# Patient Record
Sex: Female | Born: 1935 | Race: White | Hispanic: No | Marital: Single | Smoking: Never smoker
Health system: Southern US, Community
[De-identification: ages and names within clinical notes are randomized; demographics above are authoritative.]

## PROBLEM LIST (undated history)

## (undated) DIAGNOSIS — F0390 Unspecified dementia without behavioral disturbance: Secondary | ICD-10-CM

## (undated) DIAGNOSIS — I10 Essential (primary) hypertension: Secondary | ICD-10-CM

## (undated) HISTORY — PX: EYE SURGERY: SHX253

---

## 2009-02-22 ENCOUNTER — Ambulatory Visit: Payer: Self-pay | Admitting: Vascular Surgery

## 2009-02-22 ENCOUNTER — Emergency Department (HOSPITAL_COMMUNITY): Admission: EM | Admit: 2009-02-22 | Discharge: 2009-02-22 | Payer: Self-pay | Admitting: Emergency Medicine

## 2009-02-22 ENCOUNTER — Encounter (INDEPENDENT_AMBULATORY_CARE_PROVIDER_SITE_OTHER): Payer: Self-pay | Admitting: Emergency Medicine

## 2010-12-04 LAB — DIFFERENTIAL
Basophils Absolute: 0 10*3/uL (ref 0.0–0.1)
Eosinophils Absolute: 0.1 10*3/uL (ref 0.0–0.7)
Eosinophils Relative: 1 % (ref 0–5)
Monocytes Absolute: 0.7 10*3/uL (ref 0.1–1.0)

## 2010-12-04 LAB — BASIC METABOLIC PANEL
BUN: 13 mg/dL (ref 6–23)
CO2: 26 mEq/L (ref 19–32)
Chloride: 105 mEq/L (ref 96–112)
Glucose, Bld: 121 mg/dL — ABNORMAL HIGH (ref 70–99)
Potassium: 3.6 mEq/L (ref 3.5–5.1)
Sodium: 140 mEq/L (ref 135–145)

## 2010-12-04 LAB — CBC
HCT: 39.9 % (ref 36.0–46.0)
Hemoglobin: 13.7 g/dL (ref 12.0–15.0)
MCV: 94.3 fL (ref 78.0–100.0)
Platelets: 192 10*3/uL (ref 150–400)
RDW: 13 % (ref 11.5–15.5)

## 2010-12-04 LAB — PROTIME-INR: Prothrombin Time: 15.3 seconds — ABNORMAL HIGH (ref 11.6–15.2)

## 2011-11-08 DIAGNOSIS — D235 Other benign neoplasm of skin of trunk: Secondary | ICD-10-CM | POA: Diagnosis not present

## 2011-11-08 DIAGNOSIS — L570 Actinic keratosis: Secondary | ICD-10-CM | POA: Diagnosis not present

## 2011-11-08 DIAGNOSIS — Z85828 Personal history of other malignant neoplasm of skin: Secondary | ICD-10-CM | POA: Diagnosis not present

## 2011-12-19 DIAGNOSIS — H43399 Other vitreous opacities, unspecified eye: Secondary | ICD-10-CM | POA: Diagnosis not present

## 2011-12-19 DIAGNOSIS — H4010X0 Unspecified open-angle glaucoma, stage unspecified: Secondary | ICD-10-CM | POA: Diagnosis not present

## 2011-12-19 DIAGNOSIS — H04129 Dry eye syndrome of unspecified lacrimal gland: Secondary | ICD-10-CM | POA: Diagnosis not present

## 2012-01-16 DIAGNOSIS — J3489 Other specified disorders of nose and nasal sinuses: Secondary | ICD-10-CM | POA: Diagnosis not present

## 2012-01-16 DIAGNOSIS — I10 Essential (primary) hypertension: Secondary | ICD-10-CM | POA: Diagnosis not present

## 2012-01-22 DIAGNOSIS — I10 Essential (primary) hypertension: Secondary | ICD-10-CM | POA: Diagnosis not present

## 2012-01-22 DIAGNOSIS — M279 Disease of jaws, unspecified: Secondary | ICD-10-CM | POA: Diagnosis not present

## 2012-02-15 DIAGNOSIS — R0982 Postnasal drip: Secondary | ICD-10-CM | POA: Diagnosis not present

## 2012-02-15 DIAGNOSIS — M810 Age-related osteoporosis without current pathological fracture: Secondary | ICD-10-CM | POA: Diagnosis not present

## 2012-02-15 DIAGNOSIS — M279 Disease of jaws, unspecified: Secondary | ICD-10-CM | POA: Diagnosis not present

## 2012-02-15 DIAGNOSIS — I10 Essential (primary) hypertension: Secondary | ICD-10-CM | POA: Diagnosis not present

## 2012-02-26 DIAGNOSIS — R609 Edema, unspecified: Secondary | ICD-10-CM | POA: Diagnosis not present

## 2012-02-26 DIAGNOSIS — F329 Major depressive disorder, single episode, unspecified: Secondary | ICD-10-CM | POA: Diagnosis not present

## 2012-02-26 DIAGNOSIS — M1990 Unspecified osteoarthritis, unspecified site: Secondary | ICD-10-CM | POA: Diagnosis not present

## 2012-02-26 DIAGNOSIS — I10 Essential (primary) hypertension: Secondary | ICD-10-CM | POA: Diagnosis not present

## 2012-03-03 DIAGNOSIS — I10 Essential (primary) hypertension: Secondary | ICD-10-CM | POA: Diagnosis not present

## 2012-03-03 DIAGNOSIS — R350 Frequency of micturition: Secondary | ICD-10-CM | POA: Diagnosis not present

## 2012-03-03 DIAGNOSIS — R42 Dizziness and giddiness: Secondary | ICD-10-CM | POA: Diagnosis not present

## 2012-03-03 DIAGNOSIS — R110 Nausea: Secondary | ICD-10-CM | POA: Diagnosis not present

## 2012-03-03 DIAGNOSIS — R82998 Other abnormal findings in urine: Secondary | ICD-10-CM | POA: Diagnosis not present

## 2012-03-13 ENCOUNTER — Emergency Department (HOSPITAL_COMMUNITY)
Admission: EM | Admit: 2012-03-13 | Discharge: 2012-03-13 | Disposition: A | Discharge status: Home / Self Care | Payer: Medicare Other | Attending: Emergency Medicine | Admitting: Emergency Medicine

## 2012-03-13 DIAGNOSIS — R9431 Abnormal electrocardiogram [ECG] [EKG]: Secondary | ICD-10-CM | POA: Diagnosis not present

## 2012-03-13 DIAGNOSIS — R55 Syncope and collapse: Secondary | ICD-10-CM | POA: Diagnosis not present

## 2012-03-13 DIAGNOSIS — R61 Generalized hyperhidrosis: Secondary | ICD-10-CM | POA: Diagnosis not present

## 2012-03-13 DIAGNOSIS — L0291 Cutaneous abscess, unspecified: Secondary | ICD-10-CM | POA: Diagnosis not present

## 2012-03-13 DIAGNOSIS — R404 Transient alteration of awareness: Secondary | ICD-10-CM | POA: Diagnosis not present

## 2012-03-13 DIAGNOSIS — N289 Disorder of kidney and ureter, unspecified: Secondary | ICD-10-CM | POA: Insufficient documentation

## 2012-03-13 LAB — POCT I-STAT, CHEM 8
BUN: 21 mg/dL (ref 6–23)
Creatinine, Ser: 1.5 mg/dL — ABNORMAL HIGH (ref 0.50–1.10)
Glucose, Bld: 141 mg/dL — ABNORMAL HIGH (ref 70–99)
Hemoglobin: 12.9 g/dL (ref 12.0–15.0)
Potassium: 3.3 mEq/L — ABNORMAL LOW (ref 3.5–5.1)
Sodium: 137 mEq/L (ref 135–145)
TCO2: 31 mmol/L (ref 0–100)

## 2012-03-13 LAB — POCT I-STAT TROPONIN I

## 2012-03-13 NOTE — ED Notes (Signed)
Pt eating at this time denies complaints

## 2012-03-13 NOTE — ED Provider Notes (Signed)
History     CSN: [id number]  Arrival date & time 03/13/12  1709   First MD Initiated Contact with Patient 03/13/12 1807      Chief Complaint  Patient presents with  . Loss of Consciousness     HPI Pt had syncopal episode at doctors office, was having boil removed, staff reported 2 syncopal episodes per staff. EMS reports pt pale and altered when they sat her up. Orthostatic on EMS arrival 70/40. Pt reports hx of the same. CBG 107. BP meds changed last month. 22g(R)hand. 120cc of NS given en route. Pt reports she is starting to feeling better  No past medical history on file.  No past surgical history on file.  No family history on file.  History  Substance Use Topics  . Smoking status: Not on file  . Smokeless tobacco: Not on file  . Alcohol Use: Not on file    OB History    No data available      Review of Systems  All other systems reviewed and are negative.    Allergies  Codeine  Home Medications   Current Outpatient Rx  Name Route Sig Dispense Refill  . CYCLOSPORINE 0.05 % OP EMUL Both Eyes Place 1 drop into both eyes 2 (two) times daily.    Marland Kitchen LOSARTAN POTASSIUM 50 MG PO TABS Oral Take 50 mg by mouth every morning.    Marland Kitchen MIRTAZAPINE 30 MG PO TABS Oral Take 30 mg by mouth at bedtime.    . ADULT MULTIVITAMIN W/MINERALS CH Oral Take 1 tablet by mouth daily.      BP 134/65  Pulse 88  Temp 98.3 F (36.8 C) (Oral)  Resp 18  SpO2 100%  Physical Exam  Nursing note and vitals reviewed. Constitutional: She is oriented to person, place, and time. She appears well-developed and well-nourished. No distress.  HENT:  Head: Normocephalic and atraumatic.  Eyes: Pupils are equal, round, and reactive to light.  Neck: Normal range of motion.  Cardiovascular: Normal rate and intact distal pulses.   No murmur heard.      SINUS RHYTHM ~ normal P axis, V-rate =76 NONSPECIFIC INTRAVENTRICULAR CONDUCTION DELAY ~ QRSd >119mS,  not LBBB/RBBB  Interpretation Abnormal  ECG  Pulmonary/Chest: No respiratory distress.  Abdominal: Normal appearance. She exhibits no distension.  Musculoskeletal: Normal range of motion.  Neurological: She is alert and oriented to person, place, and time. No cranial nerve deficit.  Skin: Skin is warm and dry. No rash noted.  Psychiatric: She has a normal mood and affect. Her behavior is normal.    ED Course  Procedures (including critical care time)  Labs Reviewed  POCT I-STAT, CHEM 8 - Abnormal; Notable for the following:    Potassium 3.3 (*)     Chloride 93 (*)     Creatinine, Ser 1.50 (*)     Glucose, Bld 141 (*)     All other components within normal limits  POCT I-STAT TROPONIN I   No results found.   1. Vasovagal syncope   2. Renal insufficiency       MDM         Nelia Shi, MD 03/13/12 2055

## 2012-03-13 NOTE — ED Notes (Signed)
Pt had syncopal episode at doctors office, was having boil removed, staff reported 2 syncopal episodes per staff. EMS reports pt pale and altered when they sat her up. Orthostatic on EMS arrival 70/40. Pt reports hx of the same. CBG 107. BP meds changed last month. 22g(R)hand. 120cc of NS given en route. Pt reports she is starting to feeling better.

## 2012-03-17 DIAGNOSIS — R110 Nausea: Secondary | ICD-10-CM | POA: Diagnosis not present

## 2012-03-17 DIAGNOSIS — R82998 Other abnormal findings in urine: Secondary | ICD-10-CM | POA: Diagnosis not present

## 2012-03-17 DIAGNOSIS — N189 Chronic kidney disease, unspecified: Secondary | ICD-10-CM | POA: Diagnosis not present

## 2012-03-17 DIAGNOSIS — I10 Essential (primary) hypertension: Secondary | ICD-10-CM | POA: Diagnosis not present

## 2012-04-23 DIAGNOSIS — M1990 Unspecified osteoarthritis, unspecified site: Secondary | ICD-10-CM | POA: Diagnosis not present

## 2012-04-23 DIAGNOSIS — F329 Major depressive disorder, single episode, unspecified: Secondary | ICD-10-CM | POA: Diagnosis not present

## 2012-04-23 DIAGNOSIS — N189 Chronic kidney disease, unspecified: Secondary | ICD-10-CM | POA: Diagnosis not present

## 2012-04-23 DIAGNOSIS — I10 Essential (primary) hypertension: Secondary | ICD-10-CM | POA: Diagnosis not present

## 2012-05-07 DIAGNOSIS — Z1231 Encounter for screening mammogram for malignant neoplasm of breast: Secondary | ICD-10-CM | POA: Diagnosis not present

## 2012-05-08 DIAGNOSIS — Z85828 Personal history of other malignant neoplasm of skin: Secondary | ICD-10-CM | POA: Diagnosis not present

## 2012-05-08 DIAGNOSIS — L905 Scar conditions and fibrosis of skin: Secondary | ICD-10-CM | POA: Diagnosis not present

## 2012-05-16 DIAGNOSIS — H26499 Other secondary cataract, unspecified eye: Secondary | ICD-10-CM | POA: Diagnosis not present

## 2012-05-16 DIAGNOSIS — H4010X0 Unspecified open-angle glaucoma, stage unspecified: Secondary | ICD-10-CM | POA: Diagnosis not present

## 2012-05-20 DIAGNOSIS — Z23 Encounter for immunization: Secondary | ICD-10-CM | POA: Diagnosis not present

## 2012-05-28 DIAGNOSIS — I10 Essential (primary) hypertension: Secondary | ICD-10-CM | POA: Diagnosis not present

## 2012-05-28 DIAGNOSIS — R110 Nausea: Secondary | ICD-10-CM | POA: Diagnosis not present

## 2012-05-28 DIAGNOSIS — R197 Diarrhea, unspecified: Secondary | ICD-10-CM | POA: Diagnosis not present

## 2012-06-06 DIAGNOSIS — R110 Nausea: Secondary | ICD-10-CM | POA: Diagnosis not present

## 2012-06-06 DIAGNOSIS — R42 Dizziness and giddiness: Secondary | ICD-10-CM | POA: Diagnosis not present

## 2012-06-13 ENCOUNTER — Inpatient Hospital Stay (HOSPITAL_COMMUNITY)
Admission: EM | Admit: 2012-06-13 | Discharge: 2012-06-17 | DRG: 641 | Disposition: A | Discharge status: Skilled Nursing Facility | Payer: Medicare Other | Attending: Internal Medicine | Admitting: Internal Medicine

## 2012-06-13 DIAGNOSIS — F05 Delirium due to known physiological condition: Secondary | ICD-10-CM | POA: Diagnosis present

## 2012-06-13 DIAGNOSIS — R918 Other nonspecific abnormal finding of lung field: Secondary | ICD-10-CM | POA: Diagnosis not present

## 2012-06-13 DIAGNOSIS — G4700 Insomnia, unspecified: Secondary | ICD-10-CM | POA: Diagnosis present

## 2012-06-13 DIAGNOSIS — E669 Obesity, unspecified: Secondary | ICD-10-CM

## 2012-06-13 DIAGNOSIS — E871 Hypo-osmolality and hyponatremia: Secondary | ICD-10-CM | POA: Diagnosis not present

## 2012-06-13 DIAGNOSIS — R0789 Other chest pain: Secondary | ICD-10-CM | POA: Diagnosis not present

## 2012-06-13 DIAGNOSIS — R531 Weakness: Secondary | ICD-10-CM | POA: Diagnosis present

## 2012-06-13 DIAGNOSIS — R079 Chest pain, unspecified: Secondary | ICD-10-CM

## 2012-06-13 DIAGNOSIS — R5381 Other malaise: Secondary | ICD-10-CM | POA: Diagnosis not present

## 2012-06-13 DIAGNOSIS — R110 Nausea: Secondary | ICD-10-CM | POA: Diagnosis present

## 2012-06-13 DIAGNOSIS — R627 Adult failure to thrive: Secondary | ICD-10-CM | POA: Diagnosis present

## 2012-06-13 DIAGNOSIS — I10 Essential (primary) hypertension: Secondary | ICD-10-CM | POA: Diagnosis present

## 2012-06-13 DIAGNOSIS — E876 Hypokalemia: Secondary | ICD-10-CM | POA: Diagnosis not present

## 2012-06-13 DIAGNOSIS — Z79899 Other long term (current) drug therapy: Secondary | ICD-10-CM

## 2012-06-13 DIAGNOSIS — R410 Disorientation, unspecified: Secondary | ICD-10-CM

## 2012-06-13 DIAGNOSIS — M810 Age-related osteoporosis without current pathological fracture: Secondary | ICD-10-CM | POA: Diagnosis present

## 2012-06-13 DIAGNOSIS — R0602 Shortness of breath: Secondary | ICD-10-CM | POA: Diagnosis not present

## 2012-06-13 DIAGNOSIS — N189 Chronic kidney disease, unspecified: Secondary | ICD-10-CM | POA: Diagnosis not present

## 2012-06-13 DIAGNOSIS — R82998 Other abnormal findings in urine: Secondary | ICD-10-CM | POA: Diagnosis not present

## 2012-06-13 HISTORY — DX: Essential (primary) hypertension: I10

## 2012-06-14 ENCOUNTER — Inpatient Hospital Stay (HOSPITAL_COMMUNITY): Payer: Medicare Other

## 2012-06-14 ENCOUNTER — Emergency Department (HOSPITAL_COMMUNITY): Payer: Medicare Other

## 2012-06-14 ENCOUNTER — Encounter (HOSPITAL_COMMUNITY): Payer: Self-pay | Admitting: Emergency Medicine

## 2012-06-14 DIAGNOSIS — Z5189 Encounter for other specified aftercare: Secondary | ICD-10-CM | POA: Diagnosis not present

## 2012-06-14 DIAGNOSIS — R110 Nausea: Secondary | ICD-10-CM | POA: Diagnosis present

## 2012-06-14 DIAGNOSIS — R531 Weakness: Secondary | ICD-10-CM | POA: Diagnosis present

## 2012-06-14 DIAGNOSIS — F3289 Other specified depressive episodes: Secondary | ICD-10-CM | POA: Diagnosis not present

## 2012-06-14 DIAGNOSIS — I10 Essential (primary) hypertension: Secondary | ICD-10-CM | POA: Diagnosis not present

## 2012-06-14 DIAGNOSIS — F05 Delirium due to known physiological condition: Secondary | ICD-10-CM | POA: Diagnosis not present

## 2012-06-14 DIAGNOSIS — G4700 Insomnia, unspecified: Secondary | ICD-10-CM | POA: Diagnosis present

## 2012-06-14 DIAGNOSIS — E876 Hypokalemia: Secondary | ICD-10-CM | POA: Diagnosis not present

## 2012-06-14 DIAGNOSIS — R0602 Shortness of breath: Secondary | ICD-10-CM | POA: Diagnosis not present

## 2012-06-14 DIAGNOSIS — E871 Hypo-osmolality and hyponatremia: Secondary | ICD-10-CM | POA: Diagnosis present

## 2012-06-14 DIAGNOSIS — R627 Adult failure to thrive: Secondary | ICD-10-CM | POA: Diagnosis present

## 2012-06-14 DIAGNOSIS — R079 Chest pain, unspecified: Secondary | ICD-10-CM | POA: Diagnosis not present

## 2012-06-14 DIAGNOSIS — R410 Disorientation, unspecified: Secondary | ICD-10-CM | POA: Diagnosis present

## 2012-06-14 DIAGNOSIS — R5381 Other malaise: Secondary | ICD-10-CM | POA: Diagnosis not present

## 2012-06-14 DIAGNOSIS — R918 Other nonspecific abnormal finding of lung field: Secondary | ICD-10-CM | POA: Diagnosis not present

## 2012-06-14 DIAGNOSIS — Z79899 Other long term (current) drug therapy: Secondary | ICD-10-CM | POA: Diagnosis not present

## 2012-06-14 DIAGNOSIS — G319 Degenerative disease of nervous system, unspecified: Secondary | ICD-10-CM | POA: Diagnosis not present

## 2012-06-14 DIAGNOSIS — Z8673 Personal history of transient ischemic attack (TIA), and cerebral infarction without residual deficits: Secondary | ICD-10-CM | POA: Diagnosis not present

## 2012-06-14 DIAGNOSIS — M1990 Unspecified osteoarthritis, unspecified site: Secondary | ICD-10-CM | POA: Diagnosis not present

## 2012-06-14 DIAGNOSIS — G500 Trigeminal neuralgia: Secondary | ICD-10-CM | POA: Diagnosis not present

## 2012-06-14 DIAGNOSIS — M810 Age-related osteoporosis without current pathological fracture: Secondary | ICD-10-CM | POA: Diagnosis present

## 2012-06-14 LAB — BASIC METABOLIC PANEL
BUN: 10 mg/dL (ref 6–23)
CO2: 25 mEq/L (ref 19–32)
Calcium: 8.9 mg/dL (ref 8.4–10.5)
Creatinine, Ser: 0.56 mg/dL (ref 0.50–1.10)
Glucose, Bld: 96 mg/dL (ref 70–99)

## 2012-06-14 LAB — CBC
MCH: 30.9 pg (ref 26.0–34.0)
MCHC: 35.9 g/dL (ref 30.0–36.0)
MCV: 86 fL (ref 78.0–100.0)
Platelets: 198 10*3/uL (ref 150–400)
RDW: 12.2 % (ref 11.5–15.5)
WBC: 4.2 10*3/uL (ref 4.0–10.5)

## 2012-06-14 LAB — COMPREHENSIVE METABOLIC PANEL
AST: 34 U/L (ref 0–37)
BUN: 14 mg/dL (ref 6–23)
CO2: 27 mEq/L (ref 19–32)
Calcium: 9.4 mg/dL (ref 8.4–10.5)
Chloride: 85 mEq/L — ABNORMAL LOW (ref 96–112)
Creatinine, Ser: 0.72 mg/dL (ref 0.50–1.10)
GFR calc Af Amer: 88 mL/min — ABNORMAL LOW (ref >90)
GFR calc non Af Amer: 76 mL/min — ABNORMAL LOW (ref >90)
Glucose, Bld: 117 mg/dL — ABNORMAL HIGH (ref 70–99)
Total Bilirubin: 0.6 mg/dL (ref 0.3–1.2)

## 2012-06-14 LAB — CBC WITH DIFFERENTIAL/PLATELET
Eosinophils Relative: 1 % (ref 0–5)
HCT: 34.2 % — ABNORMAL LOW (ref 36.0–46.0)
Hemoglobin: 12.4 g/dL (ref 12.0–15.0)
Lymphocytes Relative: 18 % (ref 12–46)
Lymphs Abs: 1.1 10*3/uL (ref 0.7–4.0)
MCV: 87 fL (ref 78.0–100.0)
Monocytes Absolute: 0.7 10*3/uL (ref 0.1–1.0)
Monocytes Relative: 12 % (ref 3–12)
Neutro Abs: 4.3 10*3/uL (ref 1.7–7.7)
RDW: 12.2 % (ref 11.5–15.5)
WBC: 6.3 10*3/uL (ref 4.0–10.5)

## 2012-06-14 LAB — POCT I-STAT TROPONIN I: Troponin i, poc: 0 ng/mL (ref 0.00–0.08)

## 2012-06-14 MED ORDER — POTASSIUM CHLORIDE IN NACL 40-0.9 MEQ/L-% IV SOLN
INTRAVENOUS | Status: DC
  Administered 2012-06-14: 22:00:00 via INTRAVENOUS
  Administered 2012-06-14: 35 mL/h via INTRAVENOUS
  Filled 2012-06-14 (×3): qty 1000

## 2012-06-14 MED ORDER — ADULT MULTIVITAMIN W/MINERALS CH
1.0000 | ORAL_TABLET | Freq: Every day | ORAL | Status: DC
  Administered 2012-06-14 – 2012-06-17 (×4): 1 via ORAL
  Filled 2012-06-14 (×4): qty 1

## 2012-06-14 MED ORDER — MIRTAZAPINE 30 MG PO TABS
30.0000 mg | ORAL_TABLET | Freq: Every day | ORAL | Status: DC
  Administered 2012-06-14 – 2012-06-16 (×3): 30 mg via ORAL
  Filled 2012-06-14 (×4): qty 1

## 2012-06-14 MED ORDER — POTASSIUM CHLORIDE CRYS ER 20 MEQ PO TBCR
40.0000 meq | EXTENDED_RELEASE_TABLET | Freq: Once | ORAL | Status: AC
  Administered 2012-06-14: 40 meq via ORAL
  Filled 2012-06-14: qty 2

## 2012-06-14 MED ORDER — ZOLPIDEM TARTRATE 5 MG PO TABS
5.0000 mg | ORAL_TABLET | Freq: Every evening | ORAL | Status: DC | PRN
  Administered 2012-06-14 – 2012-06-16 (×3): 5 mg via ORAL
  Filled 2012-06-14 (×3): qty 1

## 2012-06-14 MED ORDER — ACETAMINOPHEN 650 MG RE SUPP: 650.0000 mg | Freq: Four times a day (QID) | RECTAL | Status: DC | PRN

## 2012-06-14 MED ORDER — ACETAMINOPHEN 325 MG PO TABS: 650.0000 mg | ORAL_TABLET | Freq: Four times a day (QID) | ORAL | Status: DC | PRN

## 2012-06-14 MED ORDER — ENSURE COMPLETE PO LIQD
237.0000 mL | Freq: Every day | ORAL | Status: DC | PRN
  Administered 2012-06-14: 237 mL via ORAL

## 2012-06-14 MED ORDER — CYCLOSPORINE 0.05 % OP EMUL
1.0000 [drp] | Freq: Two times a day (BID) | OPHTHALMIC | Status: DC
  Administered 2012-06-14 – 2012-06-17 (×7): 1 [drp] via OPHTHALMIC
  Filled 2012-06-14 (×9): qty 1

## 2012-06-14 MED ORDER — SODIUM CHLORIDE 0.9 % IV BOLUS (SEPSIS)
1000.0000 mL | Freq: Once | INTRAVENOUS | Status: AC
  Administered 2012-06-14: 1000 mL via INTRAVENOUS

## 2012-06-14 MED ORDER — FUROSEMIDE 20 MG PO TABS
10.0000 mg | ORAL_TABLET | Freq: Once | ORAL | Status: AC
  Administered 2012-06-14: 10 mg via ORAL
  Filled 2012-06-14: qty 0.5

## 2012-06-14 MED ORDER — ONDANSETRON HCL 4 MG PO TABS: 4.0000 mg | ORAL_TABLET | Freq: Four times a day (QID) | ORAL | Status: DC | PRN

## 2012-06-14 MED ORDER — PANTOPRAZOLE SODIUM 40 MG PO TBEC
40.0000 mg | DELAYED_RELEASE_TABLET | Freq: Every day | ORAL | Status: DC
  Administered 2012-06-14 – 2012-06-17 (×4): 40 mg via ORAL
  Filled 2012-06-14 (×4): qty 1

## 2012-06-14 MED ORDER — ONDANSETRON HCL 4 MG/2ML IJ SOLN: 4.0000 mg | Freq: Four times a day (QID) | INTRAMUSCULAR | Status: DC | PRN

## 2012-06-14 MED ORDER — ENOXAPARIN SODIUM 40 MG/0.4ML ~~LOC~~ SOLN
40.0000 mg | SUBCUTANEOUS | Status: DC
  Administered 2012-06-14 – 2012-06-16 (×3): 40 mg via SUBCUTANEOUS
  Filled 2012-06-14 (×5): qty 0.4

## 2012-06-14 NOTE — ED Notes (Signed)
Assisted pt to the bathroom

## 2012-06-14 NOTE — Progress Notes (Signed)
INITIAL ADULT NUTRITION ASSESSMENT Date: 06/14/2012   Time: 3:09 PM Reason for Assessment: Nutrition Risk   ASSESSMENT: Female 78 y.o.  Dx: Hyponatremia  INTERVENTION: 1. Will order patient Ensure PRN for poor PO intake at meals < 50% 2. RD to follow for nutrition plan of care.   Hx:  Past Medical History  Diagnosis Date  . Hypertension     Related Meds:  Scheduled Meds:   . cycloSPORINE  1 drop Both Eyes BID  . enoxaparin (LOVENOX) injection  40 mg Subcutaneous Q24H  . furosemide  10 mg Oral Once  . mirtazapine  30 mg Oral QHS  . multivitamin with minerals  1 tablet Oral Daily  . pantoprazole  40 mg Oral Daily  . potassium chloride SA  40 mEq Oral Once  . sodium chloride  1,000 mL Intravenous Once   Continuous Infusions:   . 0.9 % NaCl with KCl 40 mEq / L 35 mL/hr (06/14/12 1059)   PRN Meds:.acetaminophen, acetaminophen, ondansetron (ZOFRAN) IV, ondansetron   Ht: 5\' 2"  (157.5 cm)  Wt: 151 lb 0.2 oz (68.5 kg)  Ideal Wt: 50.1 kg  % Ideal Wt: 137% Wt Readings from Last 10 Encounters:  06/14/12 151 lb 0.2 oz (68.5 kg)     Usual Wt: 164 lb in July per pt % Usual Wt: 92%  Body mass index is 27.62 kg/(m^2). (Overweight)   Food/Nutrition Related Hx: Patient reported her appetite has been poor due to nausea. PO intake documented 50% at meals. She reported her appetite is better today than it has been. She reported she ate about 75% of her lunch. Her weight is down but she reported she has stopped eating sweets.   Labs:  CMP     Component Value Date/Time   NA 124* 06/14/2012 0827   K 3.3* 06/14/2012 0827   CL 90* 06/14/2012 0827   CO2 25 06/14/2012 0827   GLUCOSE 96 06/14/2012 0827   BUN 10 06/14/2012 0827   CREATININE 0.56 06/14/2012 0827   CALCIUM 8.9 06/14/2012 0827   PROT 6.7 06/14/2012 0017   ALBUMIN 3.9 06/14/2012 0017   AST 34 06/14/2012 0017   ALT 26 06/14/2012 0017   ALKPHOS 47 06/14/2012 0017   BILITOT 0.6 06/14/2012 0017   GFRNONAA 83*  06/14/2012 0827   GFRAA >90 06/14/2012 0827    Intake/Output Summary (Last 24 hours) at 06/14/12 1511 Last data filed at 06/14/12 1423  Gross per 24 hour  Intake   1280 ml  Output      5 ml  Net   1275 ml     Diet Order: General  Supplements/Tube Feeding: none at this time   IVF:    0.9 % NaCl with KCl 40 mEq / L Last Rate: 35 mL/hr (06/14/12 1059)    Estimated Nutritional Needs:   Kcal: 1250-1500 Protein: 75-89 grams  Fluid: 1 ml per kcal intake   NUTRITION DIAGNOSIS: -Inadequate oral intake (NI-2.1).  Status: Ongoing  RELATED TO: poor appetite and nausea  AS EVIDENCE BY: PO intake documented 50% at meals  MONITORING/EVALUATION(Goals): PO intake, weights, labs 1. PO intake > 75% at meals.    EDUCATION NEEDS: -No education needs identified at this time  INTERVENTION: 1. Will order patient Ensure PRN for poor PO intake at meals < 50% 2. RD to follow for nutrition plan of care.   Dietitian [phone]  DOCUMENTATION CODES Per approved criteria  -Not Applicable    Kirstyn, [facility] 06/14/2012, 3:09 PM

## 2012-06-14 NOTE — Evaluation (Signed)
Physical Therapy Evaluation Patient Details Name: Kimberly English MRN: [id number] DOB: [birth year] Today's Date: 06/14/2012 Time: 4098-1191 PT Time Calculation (min): 27 min  PT Assessment / Plan / Recommendation Clinical Impression  Patient is an 78 yo female admitted with hyponatremia and confusion.  Patient will benefit from acute PT to maximize independence prior to discharge.    PT Assessment  Patient needs continued PT services    Follow Up Recommendations  Home health PT;Supervision - Intermittent    Does the patient have the potential to tolerate intense rehabilitation      Barriers to Discharge Decreased caregiver support      Equipment Recommendations  Rolling walker with 5" wheels (will continue to monitor need)    Recommendations for Other Services     Frequency Min 3X/week    Precautions / Restrictions Precautions Precautions: None Restrictions Weight Bearing Restrictions: No   Pertinent Vitals/Pain       Mobility  Bed Mobility Bed Mobility: Supine to Sit;Sit to Supine Supine to Sit: 6: Modified independent (Device/Increase time);With rails Sit to Supine: 6: Modified independent (Device/Increase time);With rail Details for Bed Mobility Assistance: No assist or cues needed Transfers Transfers: Sit to Stand;Stand to Sit Sit to Stand: 5: Supervision;From bed Stand to Sit: 5: Supervision;To bed Details for Transfer Assistance: No cues needed.  Supervision for safety only Ambulation/Gait Ambulation/Gait Assistance: Not tested (comment) (Patient declined - felt "fuzzy")           PT Diagnosis: Generalized weakness;Abnormality of gait  PT Problem List: Decreased activity tolerance;Decreased mobility PT Treatment Interventions: Gait training;Functional mobility training;DME instruction;Stair training;Patient/family education   PT Goals Acute Rehab PT Goals PT Goal Formulation: With patient Time For Goal Achievement: 06/21/12 Potential to Achieve  Goals: Good Pt will go Sit to Stand: with modified independence PT Goal: Sit to Stand - Progress: Goal set today Pt will go Stand to Sit: with modified independence PT Goal: Stand to Sit - Progress: Goal set today Pt will Ambulate: >150 feet;with modified independence;with least restrictive assistive device PT Goal: Ambulate - Progress: Goal set today Pt will Go Up / Down Stairs: 6-9 stairs;with modified independence;with rail(s) PT Goal: Up/Down Stairs - Progress: Goal set today  Visit Information  Last PT Received On: 06/14/12 Assistance Needed: +1    Subjective Data  Subjective: "I started getting confused at about 4:00-5:00 each night.  They think it might be my medicine" Patient Stated Goal: To return home   Prior Functioning  Home Living Lives With: Alone Available Help at Discharge: Family;Available PRN/intermittently Type of Home: House Home Access: Stairs to enter [employer] of Steps: 6 Entrance Stairs-Rails: Right;Left Home Layout: Two level (Basement - lets dog out from this entrance 4x/day) Alternate Level Stairs-Number of Steps: 18 Alternate Level Stairs-Rails: Right;Left Bathroom Shower/Tub: Engineer, manufacturing systems: Standard Bathroom Accessibility: No Home Adaptive Equipment: None Prior Function Level of Independence: Independent Able to Take Stairs?: Yes Driving: Yes Vocation: Retired Musician: No difficulties    Cognition  Overall Cognitive Status: Appears within functional limits for tasks assessed/performed Arousal/Alertness: Awake/alert Orientation Level: Appears intact for tasks assessed Behavior During Session: [facility] for tasks performed    Extremity/Trunk Assessment Right Upper Extremity Assessment RUE ROM/Strength/Tone: WFL for tasks assessed RUE Sensation: WFL - Light Touch Left Upper Extremity Assessment LUE ROM/Strength/Tone: WFL for tasks assessed LUE Sensation: WFL - Light Touch Right Lower Extremity  Assessment RLE ROM/Strength/Tone: WFL for tasks assessed RLE Sensation: WFL - Light Touch Left Lower Extremity Assessment LLE ROM/Strength/Tone:  WFL for tasks assessed LLE Sensation: WFL - Light Touch Trunk Assessment Trunk Assessment: Normal   Balance Balance Balance Assessed: Yes Dynamic Sitting Balance Dynamic Sitting - Balance Support: No upper extremity supported;Feet supported Dynamic Sitting - Level of Assistance: 7: Independent Dynamic Sitting - Comments: Patient able to maintain balance while performing activities - reaching across midline, putting on socks, etc. Dynamic Standing Balance Dynamic Standing - Balance Support: No upper extremity supported Dynamic Standing - Level of Assistance: 5: Stand by assistance Dynamic Standing - Balance Activities: Reaching for objects (Marching in place) Dynamic Standing - Comments: Patient able to march in place without loss of balance - 15 steps each foot.  End of Session PT - End of Session Activity Tolerance: Patient tolerated treatment well Patient left: in bed;with call bell/phone within reach Nurse Communication: Mobility status  GP     Vena Austria 06/14/2012, 5:11 PM Durenda Hurt. Renaldo Fiddler, [facility] Acute Rehab Services Pager [phone]

## 2012-06-14 NOTE — ED Notes (Signed)
Dr. Timothy Lasso at the bedside.

## 2012-06-14 NOTE — Progress Notes (Signed)
UR completed 

## 2012-06-14 NOTE — H&P (Signed)
Kimberly English is an 78 y.o. female.   PCP:   Kimberly Meo, MD   Chief Complaint:  Weakness, FTT, Confusion, Hyponatremia, Dizzy, Hypokalemia  HPI: 80 F with HTN on Avalide who reports some BP med changes recently and 3-4 weeks of Nausea.  Seen 10/3 and 10/11 with Na of 128.  Prior Na's 136-144.  Our office was called yesterday by her son reporting confusion, FTT, and some strange/bizarre behaivours.  She was brought in to be seen by Kimberly English or NP for Nausea/Dizzy and confusion.  She looked good with decent BP but increased LE Edema.  She lives by herself with family involvement.  She is very functional at baseline.  She has not been having  fever, chills, abd pain, dysuria, frequency, bodyaches, headache, CP, or SOB.  One of her confusion episodes is described as - at the grocery store she was unable to write out a check.  She has had intermittent weakness and fatigue.   She also has long standing insomnia and was on remron until Dr Kimberly English gave her Ambien 10mg  to take hs in mid sept.  She likes the Ambien.  She has remained very focused on her sleep issues and Nausea for the last 1+ month.  Labs were done in our office and CBC and UA were (-).  BMET came back after 9 pm last night.  Na 124, K 3.3, Cl 85, CO2 25, BUN 10, Cr 0.6, glucose 93, Ca 9.4. I called her at home and discussed the results with her.  It was hard to talk with her as she was focused on her Kimberly English, and the fact that without it she would only sleep for 3 hrs.  I tried to get her to focus on the sodium and re-iterated what Kimberly English told her = No Ambien.  She had just taken a Remeron and she was in no condition to drive.  Her family was away, so I did not recommend going to the ED at that moment.   I did discuss plans with pt of head to the ED now via EMS - she did not want this and i did not want to force this issue.  We decided to start to cut back on free water as she said she has been drinking lots of water, ginger ale and other  fluids.  She will hold the Avalide (as HCT will make low Na worse) through the weekend and I was to call her back this am and continue to discuss options.  If she did end up getting admitted she would have  needed to be seen monday with repeat labs.   Shortly after this phone call her son Kimberly English [phone] called me and we discussed plan and treatment options.  Shortly afterwards the anxiety of my phone call started to scare Kimberly English and she developed CP and called 911.  She was transported to [facility].  W/Up was (-) for cardiac issues and Hyponatremia/Hypokalemia confirmed.  She was treated with 1L NS fluid bolus and 2 K pills.  I was called for inpatient admission  Her CP was moderately severe and nonradiating - centrally located  With heaviness. Her symptoms started @ 10 PM and resolved without intervention just as she was arriving to the emergency department via EMS. The patient did receive aspirin 324 mg on route.  No NTG         Past Medical History:  Past Medical History  Diagnosis Date  . Hypertension  Osteoprosis, OA, HTN,Edema, Trigeminal Neuralgia Depression   Past Surgical History  Procedure Date  . Eye surgery    Tonsils 1935 Appendectomy 1946 Cholecystectomy 2000 Cataract 1998    Allergies:   Allergies  Allergen Reactions  . Codeine Nausea And Vomiting     Medications: Prior to Admission medications   Medication Sig Start Date End Date Taking? Authorizing Provider  cycloSPORINE (RESTASIS) 0.05 % ophthalmic emulsion Place 1 drop into both eyes 2 (two) times daily.   Yes Historical Provider, MD  irbesartan-hydrochlorothiazide (AVALIDE) 150-12.5 MG per tablet Take 1 tablet by mouth daily.   Yes Historical Provider, MD  mirtazapine (REMERON) 30 MG tablet Take 30 mg by mouth at bedtime.   Yes Historical Provider, MD  Multiple Vitamin (MULTIVITAMIN WITH MINERALS) TABS Take 1 tablet by mouth daily.   Yes Historical Provider, MD  omeprazole (PRILOSEC)  20 MG capsule Take 20 mg by mouth daily.   Yes Historical Provider, MD  zolpidem (AMBIEN) 10 MG tablet Take 10 mg by mouth at bedtime as needed. For sleep   Yes Historical Provider, MD      (Not in a hospital admission)   Social History:  reports that she has never smoked. She does not have any smokeless tobacco history on file. She reports that she does not drink alcohol or use illicit drugs.  Widowed, 2 children. She does not smoke or drink alcohol.  Family History: No family history on file.  Review of Systems:  Review of Systems - CP is better. Full ROS obtained. See HPI. All other (-)   Physical Exam:  Blood pressure 152/60, pulse 82, temperature 98.6 F (37 C), temperature source Oral, resp. rate 14, SpO2 98.00%. Filed Vitals:   06/14/12 0400 06/14/12 0500 06/14/12 0534 06/14/12 0800  BP: 128/50 114/48 114/48 152/60  Pulse: 76 73 74 82  Temp:      TempSrc:      Resp: 15  18 14   SpO2: 96% 94% 98% 98%   General appearance: A and O.  A little off Head: Normocephalic, without obvious abnormality, atraumatic Eyes: conjunctivae/corneas clear. PERRL, EOM's intact.  Nose: Nares normal. Septum midline. Mucosa normal. No drainage or sinus tenderness. Throat: lips, mucosa, and tongue normal; teeth and gums normal Neck: no adenopathy, no carotid bruit, no JVD and thyroid not enlarged, symmetric, no tenderness/mass/nodules Resp: CTA B Cardio: Regular with m GI: soft, non-tender; bowel sounds normal; no masses,  no organomegaly, Overweight. Extremities: extremities normal, atraumatic, no cyanosis. Thick c 1-2+ Edema Pulses: 2+ and symmetric Lymph nodes:  no cervical lymphadenopathy Neurologic: Alert and oriented X 3, normal strength and tone. Normal symmetric reflexes.  When I arrived to do her admit she was walking back from the ED with help but appeared stronger than expected.    Labs on Admission:   [facility] 06/14/12 0017  NA 124*  K 3.1*  CL 85*  CO2 27  GLUCOSE  117*  BUN 14  CREATININE 0.72  CALCIUM 9.4  MG --  PHOS --    Basename 06/14/12 0017  AST 34  ALT 26  ALKPHOS 47  BILITOT 0.6  PROT 6.7  ALBUMIN 3.9   No results found for this basename: LIPASE:2,AMYLASE:2 in the last 72 hours  Basename 06/14/12 0017  WBC 6.3  NEUTROABS 4.3  HGB 12.4  HCT 34.2*  MCV 87.0  PLT 223   No results found for this basename: CKTOTAL:3,CKMB:3,CKMBINDEX:3,TROPONINI:3 in the last 72 hours Lab Results  Component Value Date   INR  1.2 02/22/2009     LAB RESULT POCT:  Results for orders placed during the hospital encounter of 06/13/12  CBC WITH DIFFERENTIAL      Component Value Range   WBC 6.3  4.0 - 10.5 K/uL   RBC 3.93  3.87 - 5.11 MIL/uL   Hemoglobin 12.4  12.0 - 15.0 g/dL   HCT 40.9 (*) 81.1 - 91.4 %   MCV 87.0  78.0 - 100.0 fL   MCH 31.6  26.0 - 34.0 pg   MCHC 36.3 (*) 30.0 - 36.0 g/dL   RDW 78.2  95.6 - 21.3 %   Platelets 223  150 - 400 K/uL   Neutrophils Relative 69  43 - 77 %   Neutro Abs 4.3  1.7 - 7.7 K/uL   Lymphocytes Relative 18  12 - 46 %   Lymphs Abs 1.1  0.7 - 4.0 K/uL   Monocytes Relative 12  3 - 12 %   Monocytes Absolute 0.7  0.1 - 1.0 K/uL   Eosinophils Relative 1  0 - 5 %   Eosinophils Absolute 0.1  0.0 - 0.7 K/uL   Basophils Relative 0  0 - 1 %   Basophils Absolute 0.0  0.0 - 0.1 K/uL  COMPREHENSIVE METABOLIC PANEL      Component Value Range   Sodium 124 (*) 135 - 145 mEq/L   Potassium 3.1 (*) 3.5 - 5.1 mEq/L   Chloride 85 (*) 96 - 112 mEq/L   CO2 27  19 - 32 mEq/L   Glucose, Bld 117 (*) 70 - 99 mg/dL   BUN 14  6 - 23 mg/dL   Creatinine, Ser 0.86  0.50 - 1.10 mg/dL   Calcium 9.4  8.4 - 57.8 mg/dL   Total Protein 6.7  6.0 - 8.3 g/dL   Albumin 3.9  3.5 - 5.2 g/dL   AST 34  0 - 37 U/L   ALT 26  0 - 35 U/L   Alkaline Phosphatase 47  39 - 117 U/L   Total Bilirubin 0.6  0.3 - 1.2 mg/dL   GFR calc non Af Amer 76 (*) >90 mL/min   GFR calc Af Amer 88 (*) >90 mL/min  POCT I-STAT TROPONIN I      Component Value  Range   Troponin i, poc 0.00  0.00 - 0.08 ng/mL   Comment 3               Radiological Exams on Admission: Dg Chest 2 View  06/14/2012  *RADIOLOGY REPORT*  Clinical Data: Left chest pain, shortness of breath  CHEST - 2 VIEW  Comparison: None.  Findings: Chronic interstitial markings/emphysematous changes. No pleural effusion or pneumothorax.  Cardiomediastinal silhouette is within normal limits.  Degenerative changes of the visualized thoracolumbar spine. Exaggerated thoracic kyphosis.  IMPRESSION: No evidence of acute cardiopulmonary disease.   Original Report Authenticated By: Charline Bills, M.D.       Orders placed during the hospital encounter of 06/13/12  . EKG 12-LEAD  . EKG 12-LEAD     Assessment/Plan Principal Problem:  *Hyponatremia Active Problems:  Hypokalemia  Confusion  Generalized weakness  Nausea  HTN (hypertension)  Weakness/FTT/Confusion/Dizzy/Nausea - due to  Hypokalemia and Hyponatremia Admit. Low dose gentle IVF Stop HCTZ in the Avalide (consider Avapro and Lasix)  Check TSH. Free Water Restrict CXR was (-) Follow Labs  CP - Low Risk - check one more Trop I  Nausea - Check KUB.  Hopefully improves when Na is better. Prn Zofran  Weakness - PT/OT ordered.  Hold MRI as no evidence of CVA  HTN - Adjust meds as needed.  Insomnia - May restart Ambien if Mentally she is doing well.  Edema - May need Lasix vrs aldactone and no more HCT  Depression - Mood wise appears OK.    DVT Prophylaxis with lovenox.  Jaquetta Currier M 06/14/2012, 8:48 AM

## 2012-06-14 NOTE — ED Notes (Signed)
PT. ARRIVED WITH EMS FROM HOME REPORTS LEFT SIDE CHEST PAIN /HEAVINESS ONSET THIS EVENING WITH SLIGHT SOB , DENIES NAUSEA OR VOMITTING , NO COUGH OR CONGESTION , SEEN BY HER PCP TODAY INFORMED HER THAT HER  SERUM SODIUM IS LOW .

## 2012-06-14 NOTE — ED Provider Notes (Addendum)
History     CSN: [id number]  Arrival date & time 06/13/12  2357   First MD Initiated Contact with Patient 06/14/12 0038      Chief Complaint  Patient presents with  . Chest Pain    (Consider location/radiation/quality/duration/timing/severity/associated sxs/prior treatment) HPI  This patient is an 78 year old woman to has a history of hypertension. She presents after experiencing about 2 hours of centrally located chest pressure/heaviness. Her symptoms began spontaneously around 10 PM. Her symptoms resolved without intervention just as she was arriving to the emergency department via EMS. The patient did receive aspirin 324 mg on route. There is no report of administration of nitroglycerin or any other medications other than aspirin on route.  The patient describes her pain as moderately severe and nonradiating. She denies any associated shortness of breath, diaphoresis, lightheadedness. She denies history of similar symptoms. She has no known history of coronary artery disease but states that she has never had any type of cardiac workup. The patient denies any exacerbating or relieving factors with regards to her pain.  Of note, she was seen by a nurse practitioner at the office of [facility] yesterday morning for evaluation and treatment of ongoing nausea for the past several months. She received a call late yesterday afternoon notifying her that her sodium level was low. The patient denies any recent med changes. She says she thinks that her po intake of water has been increased. Normal UOP. Taking Irbesartan/HCTZ.  Past Medical History  Diagnosis Date  . Hypertension     Past Surgical History  Procedure Date  . Eye surgery     No family history on file.  History  Substance Use Topics  . Smoking status: Never Smoker   . Smokeless tobacco: Not on file  . Alcohol Use: No    OB History    Grav Para Term Preterm Abortions TAB SAB Ect Mult Living         Review of Systems  Gen: no weight loss, fevers, chills, night sweats Eyes: no discharge or drainage, no occular pain or visual changes Nose: no epistaxis or rhinorrhea Mouth: no dental pain, no sore throat Neck: no neck pain Lungs: no SOB, cough, wheezing CV: As per history of present illness, otherwise negative Abd: As per history of present illness, otherwise negative GU: no dysuria or gross hematuria MSK: no myalgias or arthralgias Neuro: no headache, no focal neurologic deficits Skin: no rash Psyche: negative. Allergies  Codeine  Home Medications   Current Outpatient Rx  Name Route Sig Dispense Refill  . CYCLOSPORINE 0.05 % OP EMUL Both Eyes Place 1 drop into both eyes 2 (two) times daily.    . IRBESARTAN-HYDROCHLOROTHIAZIDE 150-12.5 MG PO TABS Oral Take 1 tablet by mouth daily.    Marland Kitchen MIRTAZAPINE 30 MG PO TABS Oral Take 30 mg by mouth at bedtime.    . ADULT MULTIVITAMIN W/MINERALS CH Oral Take 1 tablet by mouth daily.    Marland Kitchen OMEPRAZOLE 20 MG PO CPDR Oral Take 20 mg by mouth daily.    Marland Kitchen ZOLPIDEM TARTRATE 10 MG PO TABS Oral Take 10 mg by mouth at bedtime as needed. For sleep      BP 137/51  Pulse 81  Temp 98.6 F (37 C) (Oral)  Resp 16  SpO2 96%  Physical Exam Gen: well developed and well nourished appearing Head: NCAT Eyes: PERL, EOMI Nose: no epistaixis or rhinorrhea Mouth/throat: mucosa is moist and pink Neck: supple, no stridor Lungs: CTA B, no  wheezing, rhonchi or rales CV: RRR, no murmur, extremities appear well perfused, no peripheral edema.  Abd: soft, obese, notender, nondistended Back: no ttp, no cva ttp Skin: no rashese, wnl Neuro: CN ii-xii grossly intact, no focal deficits Psyche; normal affect,  calm and cooperative.   ED Course  Procedures (including critical care time)  Results for orders placed during the hospital encounter of 06/13/12 (from the past 24 hour(s))  CBC WITH DIFFERENTIAL     Status: Abnormal   Collection Time   06/14/12  12:17 AM      Component Value Range   WBC 6.3  4.0 - 10.5 K/uL   RBC 3.93  3.87 - 5.11 MIL/uL   Hemoglobin 12.4  12.0 - 15.0 g/dL   HCT 40.9 (*) 81.1 - 91.4 %   MCV 87.0  78.0 - 100.0 fL   MCH 31.6  26.0 - 34.0 pg   MCHC 36.3 (*) 30.0 - 36.0 g/dL   RDW 78.2  95.6 - 21.3 %   Platelets 223  150 - 400 K/uL   Neutrophils Relative 69  43 - 77 %   Neutro Abs 4.3  1.7 - 7.7 K/uL   Lymphocytes Relative 18  12 - 46 %   Lymphs Abs 1.1  0.7 - 4.0 K/uL   Monocytes Relative 12  3 - 12 %   Monocytes Absolute 0.7  0.1 - 1.0 K/uL   Eosinophils Relative 1  0 - 5 %   Eosinophils Absolute 0.1  0.0 - 0.7 K/uL   Basophils Relative 0  0 - 1 %   Basophils Absolute 0.0  0.0 - 0.1 K/uL  COMPREHENSIVE METABOLIC PANEL     Status: Abnormal   Collection Time   06/14/12 12:17 AM      Component Value Range   Sodium 124 (*) 135 - 145 mEq/L   Potassium 3.1 (*) 3.5 - 5.1 mEq/L   Chloride 85 (*) 96 - 112 mEq/L   CO2 27  19 - 32 mEq/L   Glucose, Bld 117 (*) 70 - 99 mg/dL   BUN 14  6 - 23 mg/dL   Creatinine, Ser 0.86  0.50 - 1.10 mg/dL   Calcium 9.4  8.4 - 57.8 mg/dL   Total Protein 6.7  6.0 - 8.3 g/dL   Albumin 3.9  3.5 - 5.2 g/dL   AST 34  0 - 37 U/L   ALT 26  0 - 35 U/L   Alkaline Phosphatase 47  39 - 117 U/L   Total Bilirubin 0.6  0.3 - 1.2 mg/dL   GFR calc non Af Amer 76 (*) >90 mL/min   GFR calc Af Amer 88 (*) >90 mL/min  POCT I-STAT TROPONIN I     Status: Normal   Collection Time   06/14/12 12:21 AM      Component Value Range   Troponin i, poc 0.00  0.00 - 0.08 ng/mL   Comment 3             Dg Chest 2 View  06/14/2012  *RADIOLOGY REPORT*  Clinical Data: Left chest pain, shortness of breath  CHEST - 2 VIEW  Comparison: None.  Findings: Chronic interstitial markings/emphysematous changes. No pleural effusion or pneumothorax.  Cardiomediastinal silhouette is within normal limits.  Degenerative changes of the visualized thoracolumbar spine. Exaggerated thoracic kyphosis.  IMPRESSION: No evidence of  acute cardiopulmonary disease.   Original Report Authenticated By: Charline Bills, M.D.    DDX: ACS, pneumothorax, pneumonia, pericardial or pleural effusion, gastritis,  GERD/PUD, musculoskeletal pain.   Patient with chest pain which, in light of her age and history of HTN, is concerning for ACS. She remains pain free and has been treated with ASA PTA. VS are normal in the ED. The patient needs functional study and full rule out. Initial EKG and first tpn are reassuring. Patient incidentally found to be hyponatremic and hypokalemic. We are treating with IV NS and PO KCL.  Internist on call for [facility] paged to request admission.       MDM  See above.         Brandt Loosen, MD 06/14/12 [phone]  Case discussed with Dr. Kennedy Bucker who has agreed to admit the patient and requests temporary admission orders to Tele Unit with maintenance IVF of NS at 58mL/hr following initial infusion of 1L NS in ED.   Brandt Loosen, MD 06/14/12 [phone]

## 2012-06-15 DIAGNOSIS — E876 Hypokalemia: Secondary | ICD-10-CM | POA: Diagnosis not present

## 2012-06-15 DIAGNOSIS — I10 Essential (primary) hypertension: Secondary | ICD-10-CM | POA: Diagnosis not present

## 2012-06-15 DIAGNOSIS — R110 Nausea: Secondary | ICD-10-CM | POA: Diagnosis not present

## 2012-06-15 DIAGNOSIS — E871 Hypo-osmolality and hyponatremia: Secondary | ICD-10-CM | POA: Diagnosis not present

## 2012-06-15 LAB — CBC
Hemoglobin: 11.4 g/dL — ABNORMAL LOW (ref 12.0–15.0)
MCH: 30.5 pg (ref 26.0–34.0)
MCHC: 34.2 g/dL (ref 30.0–36.0)
MCV: 89 fL (ref 78.0–100.0)
Platelets: 194 10*3/uL (ref 150–400)

## 2012-06-15 LAB — BASIC METABOLIC PANEL
BUN: 14 mg/dL (ref 6–23)
Calcium: 8.9 mg/dL (ref 8.4–10.5)
GFR calc non Af Amer: 78 mL/min — ABNORMAL LOW (ref >90)
Glucose, Bld: 78 mg/dL (ref 70–99)

## 2012-06-15 NOTE — Progress Notes (Signed)
Physical Therapy Treatment Patient Details Name: Kimberly English MRN: [id number] DOB: 1925/07/14 Today's Date: 06/15/2012 Time: 0865-7846 PT Time Calculation (min): 23 min  PT Assessment / Plan / Recommendation Comments on Treatment Session  Patient with slight unsteady gait without AD.  Recommended use of cane - son stated he would get one for patient.  Will practice at next session.  Discussed with patient and son safety on stairs to basement (son adding rail to top portion of stairs). Will practice stairs at next session.    Follow Up Recommendations  Home health PT;Supervision - Intermittent     Does the patient have the potential to tolerate intense rehabilitation     Barriers to Discharge        Equipment Recommendations  Gilmer Mor (Son reports he will obtain cane for patient)    Recommendations for Other Services    Frequency Min 3X/week   Plan Discharge plan remains appropriate;Frequency remains appropriate    Precautions / Restrictions Precautions Precautions: None Restrictions Weight Bearing Restrictions: No   Pertinent Vitals/Pain     Mobility  Transfers Transfers: Sit to Stand;Stand to Sit Sit to Stand: 5: Supervision;With upper extremity assist;With armrests;From chair/3-in-1;From toilet Stand to Sit: 5: Supervision;With upper extremity assist;With armrests;To chair/3-in-1;To toilet Details for Transfer Assistance: Verbal cues for hand placement Ambulation/Gait Ambulation/Gait Assistance: 4: Min guard Ambulation Distance (Feet): 300 Feet Assistive device: None Ambulation/Gait Assistance Details: Patient with slight decrease in balance x3 during gait.  Able to self-correct.   Gait Pattern: Step-through pattern;Decreased stride length Gait velocity: Slow gait speed      PT Goals Acute Rehab PT Goals PT Goal: Sit to Stand - Progress: Progressing toward goal PT Goal: Stand to Sit - Progress: Progressing toward goal PT Goal: Ambulate - Progress: Progressing  toward goal  Visit Information  Last PT Received On: 06/15/12 Assistance Needed: +1    Subjective Data  Subjective: "I hope to go home tomorrow"   Cognition  Overall Cognitive Status: Appears within functional limits for tasks assessed/performed Arousal/Alertness: Awake/alert Orientation Level: Appears intact for tasks assessed Behavior During Session: [facility] for tasks performed Cognition - Other Comments: At times, repeats herself    Balance  Balance Balance Assessed: Yes High Level Balance High Level Balance Activites: Turns;Sudden stops;Head turns High Level Balance Comments: Patient with slight decrease in balance during high level balance activities.  Recommended patient use cane for balance.  End of Session PT - End of Session Equipment Utilized During Treatment: Gait belt Activity Tolerance: Patient tolerated treatment well Patient left: in chair;with call bell/phone within reach;with family/visitor present Nurse Communication: Mobility status   GP     Vena Austria 06/15/2012, 2:09 PM Durenda Hurt. Renaldo Fiddler, [facility] Acute Rehab Services Pager [phone]

## 2012-06-15 NOTE — Progress Notes (Signed)
Subjective: Admitted yesterday with Weakness, FTT, Confusion, Hyponatremia, Dizzy, Hypokalemia. Avalide on hold. IVF given Free Water restricted. One dose lasix given Na and K back to normal today at 135 and 4 BP is fine CP gone.  Trop Is (-) PT/Nutrition saw pt yesterday. She has eaten the best she has in several days.  No nausea. Feeling alert and Bright.    Objective: Vital signs in last 24 hours: Temp:  [98.2 F (36.8 C)-99 F (37.2 C)] 98.2 F (36.8 C) (10/20 0500) Pulse Rate:  [73-78] 76  (10/20 0500) Resp:  [16-18] 18  (10/20 0500) BP: (104-129)/(40-72) 129/63 mmHg (10/20 0500) SpO2:  [94 %-99 %] 99 % (10/20 0500) Weight:  [68.5 kg (151 lb 0.2 oz)-70.262 kg (154 lb 14.4 oz)] 70.262 kg (154 lb 14.4 oz) (10/20 0500) Weight change:  Last BM Date: 06/13/12  CBG (last 3)  No results found for this basename: GLUCAP:3 in the last 72 hours  Intake/Output from previous day:  Intake/Output Summary (Last 24 hours) at 06/15/12 0851 Last data filed at 06/15/12 0844  Gross per 24 hour  Intake   1050 ml  Output   1428 ml  Net   -378 ml   10/19 0701 - 10/20 0700 In: 690 [P.O.:690] Out: 978 [Urine:978]   Physical Exam  General appearance: A and O Eyes: no scleral icterus Throat: oropharynx moist without erythema Resp: CTA Cardio: Reg c English GI: soft, non-tender; bowel sounds normal; no masses,  no organomegaly Extremities: Edema better 1+ and just thick.   Lab Results:  [facility] 06/15/12 0615 06/14/12 0827  NA 135 124*  K 4.0 3.3*  CL 102 90*  CO2 25 25  GLUCOSE 78 96  BUN 14 10  CREATININE 0.68 0.56  CALCIUM 8.9 8.9  MG -- --  PHOS -- --     Basename 06/14/12 0017  AST 34  ALT 26  ALKPHOS 47  BILITOT 0.6  PROT 6.7  ALBUMIN 3.9     Basename 06/15/12 0615 06/14/12 0827 06/14/12 0017  WBC 4.6 4.2 --  NEUTROABS -- -- 4.3  HGB 11.4* 11.7* --  HCT 33.3* 32.6* --  MCV 89.0 86.0 --  PLT 194 198 --    Lab Results  Component Value Date   INR  1.2 02/22/2009     Basename 06/14/12 2126 06/14/12 1525 06/14/12 0828  CKTOTAL -- -- --  CKMB -- -- --  CKMBINDEX -- -- --  TROPONINI <0.30 <0.30 <0.30     Basename 06/14/12 0827  TSH 1.969  T4TOTAL --  T3FREE --  THYROIDAB --    No results found for this basename: VITAMINB12:2,FOLATE:2,FERRITIN:2,TIBC:2,IRON:2,RETICCTPCT:2 in the last 72 hours  Micro Results: No results found for this or any previous visit (from the past 240 hour(s)).   Studies/Results: Dg Chest 2 View  06/14/2012  *RADIOLOGY REPORT*  Clinical Data: Left chest pain, shortness of breath  CHEST - 2 VIEW  Comparison: None.  Findings: Chronic interstitial markings/emphysematous changes. No pleural effusion or pneumothorax.  Cardiomediastinal silhouette is within normal limits.  Degenerative changes of the visualized thoracolumbar spine. Exaggerated thoracic kyphosis.  IMPRESSION: No evidence of acute cardiopulmonary disease.   Original Report Authenticated By: Charline Bills, English.D.    Abd 1 View (kub)  06/14/2012  *RADIOLOGY REPORT*  Clinical Data: Nausea.  Excessive gas.  Hypertension.  ABDOMEN - 1 VIEW  Comparison: None.  Findings: Mild prominence of colonic gas is observed.  No dilated small bowel identified.  Axial loss of articular space  is present in the hips, left greater than right, with associated spurring of the femoral heads.  IMPRESSION:  1.  Mild prominence of colonic gas, particularly in the proximal half of the colon. No overtly dilated colon. 2.  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   Original Report Authenticated By: Dellia Cloud, English.D.      Medications: Scheduled:   . cycloSPORINE  1 drop Both Eyes BID  . enoxaparin (LOVENOX) injection  40 mg Subcutaneous Q24H  . furosemide  10 mg Oral Once  . mirtazapine  30 mg Oral QHS  . multivitamin with minerals  1 tablet Oral Daily  . pantoprazole  40 mg Oral Daily   Continuous:   . 0.9 % NaCl with  KCl 40 mEq / L 35 mL/hr at 06/14/12 2222     Assessment/Plan: Principal Problem:  *Hyponatremia Active Problems:  Hypokalemia  Confusion  Generalized weakness  Nausea  HTN (hypertension)   Weakness/FTT/Confusion/Dizzy/Nausea - due to Hypokalemia and Hyponatremia  Better this am.  Unhook IV/IVF, stay off Avalide, continue Free water restriction. Keep Moving and functioning. Check labs in am and if fine - D/c tomorrow.  CP - Low Risk -  Trop I (-)  Nausea - KUB (-). She feels better.  Prn Zofran   Weakness - PT/OT.  HTN - Adjust meds as needed.   Insomnia - Ok for Ambien prn as mentally she is better   Edema - May need Lasix vrs aldactone as outpt.  She is at baseline.  Depression - Mood wise appears OK/fine.  Anemia - Hbg 11.4-11.7 and OK.  Will leave for outpt w/up if warranted.   DVT Prophylaxis with lovenox.    LOS: 2 days   Kimberly English 06/15/2012, 8:51 AM

## 2012-06-16 ENCOUNTER — Inpatient Hospital Stay (HOSPITAL_COMMUNITY): Payer: Medicare Other

## 2012-06-16 DIAGNOSIS — E876 Hypokalemia: Secondary | ICD-10-CM | POA: Diagnosis not present

## 2012-06-16 DIAGNOSIS — R110 Nausea: Secondary | ICD-10-CM | POA: Diagnosis not present

## 2012-06-16 DIAGNOSIS — G319 Degenerative disease of nervous system, unspecified: Secondary | ICD-10-CM | POA: Diagnosis not present

## 2012-06-16 DIAGNOSIS — F05 Delirium due to known physiological condition: Secondary | ICD-10-CM | POA: Diagnosis not present

## 2012-06-16 DIAGNOSIS — E871 Hypo-osmolality and hyponatremia: Secondary | ICD-10-CM | POA: Diagnosis not present

## 2012-06-16 DIAGNOSIS — Z8673 Personal history of transient ischemic attack (TIA), and cerebral infarction without residual deficits: Secondary | ICD-10-CM | POA: Diagnosis not present

## 2012-06-16 DIAGNOSIS — I10 Essential (primary) hypertension: Secondary | ICD-10-CM | POA: Diagnosis not present

## 2012-06-16 LAB — BASIC METABOLIC PANEL
Calcium: 9.1 mg/dL (ref 8.4–10.5)
Creatinine, Ser: 0.59 mg/dL (ref 0.50–1.10)
GFR calc non Af Amer: 81 mL/min — ABNORMAL LOW (ref >90)
Glucose, Bld: 91 mg/dL (ref 70–99)
Sodium: 136 mEq/L (ref 135–145)

## 2012-06-16 LAB — CBC
MCH: 31 pg (ref 26.0–34.0)
Platelets: 191 10*3/uL (ref 150–400)
RBC: 3.84 MIL/uL — ABNORMAL LOW (ref 3.87–5.11)
RDW: 12.6 % (ref 11.5–15.5)

## 2012-06-16 NOTE — Progress Notes (Addendum)
Physical Therapy Treatment Patient Details Name: Kimberly English MRN: [id number] DOB: 09-28-1924 Today's Date: 06/16/2012 Time: 1020-1040 PT Time Calculation (min): 20 min  PT Assessment / Plan / Recommendation Comments on Treatment Session  Trialed ambulation with cane & performed flight of stairs today.  Pt moves fairly well but still needing consistent min guard (A) for mobility.  Pt lives alone, & states she is not sure how much (A) she will have at home.   At this time would recommend 24 hr (A)/(S) & if not available she will need SNF  to maximize strength, (I) with mobility, balance, & safety.      Follow Up Recommendations  Home health PT;  24 hr Supervision/Assistance for mobility; SNF if 24 hr (A) unavailable.       Does the patient have the potential to tolerate intense rehabilitation     Barriers to Discharge        Equipment Recommendations  Cane    Recommendations for Other Services    Frequency Min 3X/week   Plan Discharge plan remains appropriate;Frequency remains appropriate    Precautions / Restrictions Precautions Precautions: None Restrictions Weight Bearing Restrictions: No       Mobility  Bed Mobility Bed Mobility: Not assessed Transfers Transfers: Sit to Stand;Stand to Sit Sit to Stand: 6: Modified independent (Device/Increase time);With upper extremity assist;With armrests;From chair/3-in-1 Stand to Sit: 6: Modified independent (Device/Increase time);With upper extremity assist;With armrests;To chair/3-in-1 Details for Transfer Assistance: demonstrated proper/safe technique.  Ambulation/Gait Ambulation/Gait Assistance: 4: Min guard Ambulation Distance (Feet): 300 Feet Assistive device: Straight cane Ambulation/Gait Assistance Details: Close guarding but no physical contact needed.  Cues for safe use of cane.  Pt states she feels stronger but still not at baseline.   Gait Pattern: Step-through pattern;Decreased stride length Gait velocity: Slow  gait speed Stairs: Yes Stairs Assistance: 4: Min guard Stairs Assistance Details (indicate cue type and reason): cues for sequencing & safe use of cane.  Encouraged step-to pattern vs alternating to increase safety & ensure balance before taking next step.   Stair Management Technique: One rail Right;Step to pattern;Forwards;With cane Number of Stairs: 12  Wheelchair Mobility Wheelchair Mobility: No      PT Goals Acute Rehab PT Goals Time For Goal Achievement: 06/21/12 Potential to Achieve Goals: Good Pt will go Sit to Stand: with modified independence PT Goal: Sit to Stand - Progress: Met Pt will go Stand to Sit: with modified independence PT Goal: Stand to Sit - Progress: Met Pt will Ambulate: >150 feet;with modified independence;with least restrictive assistive device PT Goal: Ambulate - Progress: Progressing toward goal Pt will Go Up / Down Stairs: 6-9 stairs;with modified independence;with rail(s) PT Goal: Up/Down Stairs - Progress: Progressing toward goal  Visit Information  Last PT Received On: 06/16/12 Assistance Needed: +1    Subjective Data  Subjective: "I don't know who I would get to come stay with me" Patient Stated Goal: To return home   Cognition  Overall Cognitive Status: Appears within functional limits for tasks assessed/performed Arousal/Alertness: Awake/alert Orientation Level: Appears intact for tasks assessed Behavior During Session: [facility] for tasks performed    Balance     End of Session PT - End of Session Equipment Utilized During Treatment: Gait belt Activity Tolerance: Patient tolerated treatment well Patient left: in chair Nurse Communication: Mobility status     Verdell Face, [place] [phone] 06/16/2012

## 2012-06-16 NOTE — Clinical Documentation Improvement (Signed)
CHANGE MENTAL STATUS DOCUMENTATION CLARIFICATION   THIS DOCUMENT IS NOT A PERMANENT PART OF THE MEDICAL RECORD  TO RESPOND TO THE THIS QUERY, FOLLOW THE INSTRUCTIONS BELOW:  1. If needed, update documentation for the patient's encounter via the notes activity.  2. Access this query again and click edit on the In [employer].  3. After updating, or not, click F2 to complete all highlighted (required) fields concerning your review. Select "additional documentation in the medical record" OR "no additional documentation provided".  4. Click Sign note button.  5. The deficiency will fall out of your In Basket *Please let us know if you are not able to complete this workflow by phone or e-mail (listed below).         06/16/12  Dear Dr. [name] Marton Redwood  In an effort to better capture your patient's severity of illness, reflect appropriate length of stay and utilization of resources, a review of the patient medical record has revealed the following indicators.    Based on your clinical judgment, please clarify and document in a progress note and/or discharge summary the clinical condition associated with the following supporting information:  In responding to this query please exercise your independent judgment.  The fact that a query is asked, does not imply that any particular answer is desired or expected.  Possible Clinical Conditions?   Encephalopathy (describe type if known)                       Anoxic                       Septic                       Alcoholic                        Hepatic                       Hypertensive                       Metabolic                       Toxic Acute exacerbation of known dementia (indicate type) New diagnosis of Dementia, Alzheimer's, cerebral atherosclerosis Other Condition Cannot Clinically Determine   Supporting Information:  Risk Factors:(As per notes)  "Admitted with Weakness, FTT, Confusion, Hyponatremia, Dizzy,  Hypokalemia    Signs & Symptoms:(As per notes):Weakness/FTT/Confusion/Dizzy/Nausea - due to Hypokalemia and Hyponatremia     Diagnostics:  Lab: 06/14/12  NA=124  Treatment: 09-15-11  Low dose gentle IVF  Stop HCTZ in the Avalide (consider Avapro and Lasix)  Check TSH.  Free Water Restrict   Reviewed:  no additional documentation provided  Thank You,  Joanette Gula Delk RN, BSN Clinical Documentation Specialist: [phone] Pager Health Information Management [place]

## 2012-06-16 NOTE — Evaluation (Signed)
Occupational Therapy Evaluation Patient Details Name: Kimberly English MRN: [id number] DOB: 06-19-1925 Today's Date: 06/16/2012 Time: 1107-1130 OT Time Calculation (min): 23 min  OT Assessment / Plan / Recommendation Clinical Impression  This 78 yo female admitted Weakness, FTT, Confusion, Hyponatremia, Dizzy, Hypokalemia presents to acute OT with deficts below. Will benefit from acute OT with follow up at SNF.    OT Assessment  Patient needs continued OT Services    Follow Up Recommendations  Skilled nursing facility    Barriers to Discharge Decreased caregiver support    Equipment Recommendations  Tub/shower seat       Frequency  Min 2X/week    Precautions / Restrictions Precautions Precautions: None Restrictions Weight Bearing Restrictions: No       ADL  Eating/Feeding: Simulated;Independent Where Assessed - Eating/Feeding: Chair Grooming: Performed;Brushing hair;Supervision/safety Where Assessed - Grooming: Unsupported standing Upper Body Bathing: Simulated;Supervision/safety Where Assessed - Upper Body Bathing: Unsupported sit to stand Lower Body Bathing: Simulated;Supervision/safety Where Assessed - Lower Body Bathing: Unsupported sit to stand Upper Body Dressing: Simulated;Supervision/safety Where Assessed - Upper Body Dressing: Unsupported sit to stand Lower Body Dressing: Performed;Supervision/safety Where Assessed - Lower Body Dressing: Unsupported sit to stand Toilet Transfer: Supervision/safety;Performed Toilet Transfer Method: Sit to Barista: Regular height toilet Toileting - Clothing Manipulation and Hygiene: Performed;Independent Where Assessed - Toileting Clothing Manipulation and Hygiene: Standing Equipment Used: Cane Transfers/Ambulation Related to ADLs: Supervision with APC ADL Comments: She was able to bend over sreadying with her SPC and pick two bags off of the floor and place them in a chair without LOB.    OT  Diagnosis: Generalized weakness  OT Problem List: Impaired balance (sitting and/or standing);Decreased cognition;Decreased knowledge of use of DME or AE OT Treatment Interventions: Self-care/ADL training;DME and/or AE instruction;Patient/family education;Balance training;Cognitive remediation/compensation   OT Goals Acute Rehab OT Goals OT Goal Formulation: With patient Time For Goal Achievement: 06/23/12 Potential to Achieve Goals: Good ADL Goals Pt Will Perform Grooming: with modified independence;Unsupported;Standing at sink (2 tasks) ADL Goal: Grooming - Progress: Goal set today Pt Will Perform Tub/Shower Transfer: with modified independence;Ambulation;with DME;Shower seat with back ADL Goal: Web designer - Progress: Goal set today Miscellaneous OT Goals Miscellaneous OT Goal #1: Pt will be able to follow tasks in the PM without confusion and/or be able to problem solve through confusion with min A OT Goal: Miscellaneous Goal #1 - Progress: Goal set today  Visit Information  Last OT Received On: 06/16/12 Assistance Needed: +1    Subjective Data  Subjective: I just tend to get confused every afternoon   Prior Functioning     Home Living Lives With: Alone Available Help at Discharge: Family;Available PRN/intermittently Type of Home: House Home Access: Stairs to enter [employer] of Steps: 6 Entrance Stairs-Rails: Right;Left Home Layout: Two level (Basement, lets dog out from this enterance 4x/day) Alternate Level Stairs-Number of Steps: 18 Alternate Level Stairs-Rails: Right;Left (son extending rail on one side) Bathroom Shower/Tub: Tub/shower unit;Curtain Firefighter: Standard Bathroom Accessibility: No Home Adaptive Equipment: None Prior Function Level of Independence: Independent Able to Take Stairs?: Yes Driving: Yes Vocation: Retired Musician: No difficulties Dominant Hand: Right            Cognition  Overall  Cognitive Status: Impaired Area of Impairment: Executive functioning (see coments) Arousal/Alertness: Awake/alert Orientation Level: Oriented X4 / Intact Behavior During Session: WFL for tasks performed Cognition - Other Comments: Per pt each afternoon she gets confused  and knows that she is confused, but  does not know what to do about it. 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")    Extremity/Trunk Assessment Right Upper Extremity Assessment RUE ROM/Strength/Tone: Within functional levels Left Upper Extremity Assessment LUE ROM/Strength/Tone: Within functional levels     Mobility Bed Mobility Bed Mobility: Not assessed Transfers Transfers: Sit to Stand;Stand to Sit Sit to Stand: 5: Supervision;With upper extremity assist;With armrests;From chair/3-in-1 Stand to Sit: 5: Supervision;With upper extremity assist;With armrests;To chair/3-in-1 Details for Transfer Assistance: Did need verbal cues x2 to use her [facility] (she has never used a cane until this admission, so I can see how it would be easy to forget to use it). She was however safe within the enviroment of her hospital room without it.              End of Session OT - End of Session Equipment Utilized During Treatment:  [facility]) Activity Tolerance: Patient tolerated treatment well Patient left: in chair;with call bell/phone within reach Nurse Communication:  (with CM about SNF probably a better option short term)       Evette Georges [phone] 06/16/2012, 11:55 AM

## 2012-06-16 NOTE — Care Management Note (Signed)
    Page 1 of 1   06/16/2012     12:26:28 PM   CARE MANAGEMENT NOTE 06/16/2012  Patient:  Kimberly English, Kimberly English   Account Number:  [id number]  Date Initiated:  06/16/2012  Documentation initiated by:  GRAVES-BIGELOW,Mackinzee Roszak  Subjective/Objective Assessment:   Pt admitted with fall syncope . Pt is from home alone and does not have family support that is close by. Pt has son in [place].     Action/Plan:   CM did speak to pt about [facility] services, however plan is not safe for home due to pt has no supervision at home. Pt will benefit from Short term snf placement. CM did relay information to CSW and she will contact the son.   Anticipated DC Date:  06/16/2012   Anticipated DC Plan:  SKILLED NURSING FACILITY  In-house referral  Clinical Social Worker      DC Planning Services  CM consult      Choice offered to / List presented to:             Status of service:  In process, will continue to follow Medicare Important Message given?   (If response is "NO", the following Medicare IM given date fields will be blank) Date Medicare IM given:   Date Additional Medicare IM given:    Discharge Disposition:    Per UR Regulation:  Reviewed for med. necessity/level of care/duration of stay  If discussed at Long Length of Stay Meetings, dates discussed:    Comments:

## 2012-06-16 NOTE — Clinical Social Work Placement (Signed)
     Clinical Social Work Department CLINICAL SOCIAL WORK PLACEMENT NOTE 06/16/2012  Patient:  ARIELIS, LEONHART  Account Number:  [id number] Admit date:  06/13/2012  Clinical Social Worker:  Margaree Mackintosh  Date/time:  06/16/2012 01:28 PM  Clinical Social Work is seeking post-discharge placement for this patient at the following level of care:   SKILLED NURSING   (*CSW will update this form in Epic as items are completed)   06/16/2012  Patient/family provided with [facility] Department of Clinical Social Works list of facilities offering this level of care within the geographic area requested by the patient (or if unable, by the patients family).  06/16/2012  Patient/family informed of their freedom to choose among providers that offer the needed level of care, that participate in Medicare, Medicaid or managed care program needed by the patient, have an available bed and are willing to accept the patient.  06/16/2012  Patient/family informed of [facility] ownership interest in [facility], as well as of the fact that they are under no obligation to receive care at this facility.  PASARR submitted to EDS on 06/16/2012 PASARR number received from EDS on 06/16/2012  FL2 transmitted to all facilities in geographic area requested by pt/family on  06/16/2012 FL2 transmitted to all facilities within larger geographic area on   Patient informed that his/her managed care company has contracts with or will negotiate with  certain facilities, including the following:     Patient/family informed of bed offers received:   Patient chooses bed at  Physician recommends and patient chooses bed at    Patient to be transferred to  on   Patient to be transferred to facility by   The following physician request were entered in Epic:   Additional Comments:

## 2012-06-16 NOTE — Clinical Social Work Psychosocial (Signed)
     Clinical Social Work Department BRIEF PSYCHOSOCIAL ASSESSMENT 06/16/2012  Patient:  Kimberly English, Kimberly English     Account Number:  [id number]     Admit date:  06/13/2012  Clinical Social Worker:  Margaree Mackintosh  Date/Time:  06/16/2012 12:00 M  Referred by:  Physician  Date Referred:  06/16/2012 Referred for  SNF Placement   Other Referral:   Interview type:  Family Other interview type:    PSYCHOSOCIAL DATA Living Status:  ALONE Admitted from facility:   Level of care:   Primary support name:  Kimberly English: [phone] Primary support relationship to patient:  CHILD, ADULT Degree of support available:   Adequate.    CURRENT CONCERNS Current Concerns  Post-Acute Placement   Other Concerns:    SOCIAL WORK ASSESSMENT / PLAN Clinical Social Worker recieved referral from RN and MD indicating pt is not able to have 24 hour supervision in place at dc and is requesting dc planning be coordinated with her son, Kimberly English.  CSW reviewed chart and spoke with Kimberly English by phone.  CSW introduced self, explained role, and provided support.  Kimberly English agreeable to [employer].  CSW to begin ST-SNF search and assist as needed.   Assessment/plan status:  Information/Referral to [facility] Other assessment/ plan:   Information/referral to community resources:   SNF    PATIENTS/FAMILYS RESPONSE TO PLAN OF CARE: Son was pleasant and engaged in conversation.  Son thanked CSW for intervention.

## 2012-06-16 NOTE — Progress Notes (Signed)
Agree with PTA.    Arik Husmann, PT [phone]  

## 2012-06-16 NOTE — Progress Notes (Signed)
Subjective: Had episode of confusion from 4:30- 10 - probably more related to anxiety than anything else. Did not get Ambien until 10 Mentally fine this am Admitted with Weakness, FTT, Confusion, Hyponatremia, Dizzy, Hypokalemia. Avalide on hold. Had BM 4 pm yesterday. Free Water restricted. BP is fine CP gone.  Trop Is (-) Still eating well.  No nausea. Feeling alert and Bright.    Objective: Vital signs in last 24 hours: Temp:  [98.4 F (36.9 C)-98.8 F (37.1 C)] 98.4 F (36.9 C) (10/21 0600) Pulse Rate:  [61-79] 79  (10/21 0600) Resp:  [15-20] 15  (10/21 0600) BP: (116-123)/(47-57) 116/47 mmHg (10/21 0600) SpO2:  [94 %-99 %] 99 % (10/21 0600) Weight change:  Last BM Date: 06/13/12  CBG (last 3)  No results found for this basename: GLUCAP:3 in the last 72 hours  Intake/Output from previous day:  Intake/Output Summary (Last 24 hours) at 06/16/12 0731 Last data filed at 06/15/12 1900  Gross per 24 hour  Intake    600 ml  Output   1002 ml  Net   -402 ml   10/20 0701 - 10/21 0700 In: 600 [P.O.:600] Out: 1002 [Urine:1001; Stool:1]   Physical Exam  General appearance: A and O Eyes: no scleral icterus Throat: oropharynx moist without erythema Resp: CTA Cardio: Reg c m GI: soft, non-tender; bowel sounds normal; no masses,  no organomegaly Extremities: Edema better 1+ and just thick.   Lab Results:  [facility] 06/15/12 0615 06/14/12 0827  NA 135 124*  K 4.0 3.3*  CL 102 90*  CO2 25 25  GLUCOSE 78 96  BUN 14 10  CREATININE 0.68 0.56  CALCIUM 8.9 8.9  MG -- --  PHOS -- --     Basename 06/14/12 0017  AST 34  ALT 26  ALKPHOS 47  BILITOT 0.6  PROT 6.7  ALBUMIN 3.9     Basename 06/15/12 0615 06/14/12 0827 06/14/12 0017  WBC 4.6 4.2 --  NEUTROABS -- -- 4.3  HGB 11.4* 11.7* --  HCT 33.3* 32.6* --  MCV 89.0 86.0 --  PLT 194 198 --    Lab Results  Component Value Date   INR 1.2 02/22/2009     Basename 06/14/12 2126 06/14/12 1525 06/14/12  0828  CKTOTAL -- -- --  CKMB -- -- --  CKMBINDEX -- -- --  TROPONINI <0.30 <0.30 <0.30     Basename 06/14/12 0827  TSH 1.969  T4TOTAL --  T3FREE --  THYROIDAB --    No results found for this basename: VITAMINB12:2,FOLATE:2,FERRITIN:2,TIBC:2,IRON:2,RETICCTPCT:2 in the last 72 hours  Micro Results: No results found for this or any previous visit (from the past 240 hour(s)).   Studies/Results: Abd 1 View (kub)  06/14/2012  *RADIOLOGY REPORT*  Clinical Data: Nausea.  Excessive gas.  Hypertension.  ABDOMEN - 1 VIEW  Comparison: None.  Findings: Mild prominence of colonic gas is observed.  No dilated small bowel identified.  Axial loss of articular space is present in the hips, left greater than right, with associated spurring of the femoral heads.  IMPRESSION:  1.  Mild prominence of colonic gas, particularly in the proximal half of the colon. No overtly dilated colon. 2.  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   Original Report Authenticated By: Dellia Cloud, M.D.      Medications: Scheduled:    . cycloSPORINE  1 drop Both Eyes BID  . enoxaparin (LOVENOX) injection  40 mg Subcutaneous Q24H  .  mirtazapine  30 mg Oral QHS  . multivitamin with minerals  1 tablet Oral Daily  . pantoprazole  40 mg Oral Daily   Continuous:    . DISCONTD: 0.9 % NaCl with KCl 40 mEq / L 35 mL/hr at 06/14/12 2222     Assessment/Plan: Principal Problem:  *Hyponatremia Active Problems:  Hypokalemia  Confusion  Generalized weakness  Nausea  HTN (hypertension)   Weakness/FTT/Confusion/Dizzy/Nausea - due to Hypokalemia and Hyponatremia  Better 1/20.  This am labs P.  Stay off Avalide, continue Free water restriction. Keep Moving and functioning. D/c today anticipated.  CP - Low Risk -  Trop I (-)  Nausea - KUB (-). She feels better - Has been eating well here in hospital.  Prn Zofran   Weakness - PT/OT. Home health PT;Supervision -  Intermittent   HTN - Adjust meds as needed. BP still perfect and BP med on hold.  Insomnia - Ambien prn as mentally she is better   Edema - May need Lasix vrs aldactone as outpt.  She is at baseline.  Keep legs elevated  Depression - Mood wise appears OK/fine.  Anemia - Hbg 11.4-11.7 and OK.  Will leave for outpt w/up if warranted.  Confusion every afternoon - including yesterday again - check CCT to be sure no underlying pathology.  SHe has a history of Trigeminal Neuralgia and Gamma Knife Rx @ 10 yrs ago at [facility].   DVT Prophylaxis with lovenox.    LOS: 3 days   Kimberly English M 06/16/2012, 7:31 AM

## 2012-06-17 DIAGNOSIS — M1990 Unspecified osteoarthritis, unspecified site: Secondary | ICD-10-CM | POA: Diagnosis not present

## 2012-06-17 DIAGNOSIS — R5381 Other malaise: Secondary | ICD-10-CM | POA: Diagnosis not present

## 2012-06-17 DIAGNOSIS — F329 Major depressive disorder, single episode, unspecified: Secondary | ICD-10-CM | POA: Diagnosis not present

## 2012-06-17 DIAGNOSIS — E876 Hypokalemia: Secondary | ICD-10-CM | POA: Diagnosis not present

## 2012-06-17 DIAGNOSIS — Z5189 Encounter for other specified aftercare: Secondary | ICD-10-CM | POA: Diagnosis not present

## 2012-06-17 DIAGNOSIS — E871 Hypo-osmolality and hyponatremia: Secondary | ICD-10-CM | POA: Diagnosis not present

## 2012-06-17 DIAGNOSIS — J301 Allergic rhinitis due to pollen: Secondary | ICD-10-CM | POA: Diagnosis not present

## 2012-06-17 DIAGNOSIS — R110 Nausea: Secondary | ICD-10-CM | POA: Diagnosis not present

## 2012-06-17 DIAGNOSIS — G500 Trigeminal neuralgia: Secondary | ICD-10-CM | POA: Diagnosis not present

## 2012-06-17 DIAGNOSIS — I10 Essential (primary) hypertension: Secondary | ICD-10-CM | POA: Diagnosis not present

## 2012-06-17 DIAGNOSIS — G4700 Insomnia, unspecified: Secondary | ICD-10-CM | POA: Diagnosis not present

## 2012-06-17 DIAGNOSIS — M810 Age-related osteoporosis without current pathological fracture: Secondary | ICD-10-CM | POA: Diagnosis not present

## 2012-06-17 LAB — BASIC METABOLIC PANEL
CO2: 26 mEq/L (ref 19–32)
Calcium: 9.2 mg/dL (ref 8.4–10.5)
Creatinine, Ser: 0.66 mg/dL (ref 0.50–1.10)
GFR calc Af Amer: >90 mL/min (ref >90)
GFR calc non Af Amer: 78 mL/min — ABNORMAL LOW (ref >90)
Sodium: 140 mEq/L (ref 135–145)

## 2012-06-17 NOTE — Discharge Summary (Signed)
DISCHARGE SUMMARY  Kimberly English  MR#: [id number]  DOB:Jan 15, 1925  Date of Admission: 06/13/2012 Date of Discharge: 06/17/2012  Attending Physician:Kamren Heskett A  Patient's Kimberly English A, MD  Consults:  none  Discharge Diagnoses: Principal Problem:  *Hyponatremia Active Problems:  Hypokalemia  Confusion  Generalized weakness  Nausea  HTN (hypertension)   Discharge Medications:   Medication List     As of 06/17/2012  7:03 AM    STOP taking these medications         irbesartan-hydrochlorothiazide 150-12.5 MG per tablet   Commonly known as: AVALIDE      TAKE these medications         cycloSPORINE 0.05 % ophthalmic emulsion   Commonly known as: RESTASIS   Place 1 drop into both eyes 2 (two) times daily.      mirtazapine 30 MG tablet   Commonly known as: REMERON   Take 30 mg by mouth at bedtime.      multivitamin with minerals Tabs   Take 1 tablet by mouth daily.      omeprazole 20 MG capsule   Commonly known as: PRILOSEC   Take 20 mg by mouth daily.      zolpidem 10 MG tablet   Commonly known as: AMBIEN   Take 10 mg by mouth at bedtime as needed. For sleep        Hospital Procedures: Dg Chest 2 View  06/14/2012  *RADIOLOGY REPORT*  Clinical Data: Left chest pain, shortness of breath  CHEST - 2 VIEW  Comparison: None.  Findings: Chronic interstitial markings/emphysematous changes. No pleural effusion or pneumothorax.  Cardiomediastinal silhouette is within normal limits.  Degenerative changes of the visualized thoracolumbar spine. Exaggerated thoracic kyphosis.  IMPRESSION: No evidence of acute cardiopulmonary disease.   Original Report Authenticated By: Charline Bills, M.D.    Abd 1 View (kub)  06/14/2012  *RADIOLOGY REPORT*  Clinical Data: Nausea.  Excessive gas.  Hypertension.  ABDOMEN - 1 VIEW  Comparison: None.  Findings: Mild prominence of colonic gas is observed.  No dilated small bowel identified.  Axial loss of articular  space is present in the hips, left greater than right, with associated spurring of the femoral heads.  IMPRESSION:  1.  Mild prominence of colonic gas, particularly in the proximal half of the colon. No overtly dilated colon. 2.  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   Original Report Authenticated By: Dellia Cloud, M.D.    Ct Head Wo Contrast  06/16/2012  *RADIOLOGY REPORT*  Clinical Data: Episode of confusion.  CT HEAD WITHOUT CONTRAST  Technique:  Contiguous axial images were obtained from the base of the skull through the vertex without contrast.  Comparison: None.  Findings: No intracranial hemorrhage.  Remote small anterior right cerebellar infarct.  No CT evidence of large acute infarct.  No intracranial mass lesion detected on this unenhanced exam.  Global atrophy without hydrocephalus.  Vascular calcifications.  Minimal mucosal thickening maxillary sinuses.  IMPRESSION: No intracranial hemorrhage or CT evidence of large acute infarct.  Remote small anterior right cerebellar infarct.  Mild global atrophy.   Original Report Authenticated By: Fuller Canada, M.D.     History of Present Illness:  16 F with HTN on Avalide who reports some BP med changes recently and 3-4 weeks of Nausea. Seen 10/3 and 10/11 with Na of 128. Prior Na's 136-144. Our office was called yesterday by her son reporting confusion, FTT, and some strange/bizarre behaivours. She  was brought in to be seen by Kimberly English or NP for Nausea/Dizzy and confusion. She looked good with decent BP but increased LE Edema. She lives by herself with family involvement. She is very functional at baseline. She has not been having fever, chills, abd pain, dysuria, frequency, bodyaches, headache, CP, or SOB. One of her confusion episodes is described as - at the grocery store she was unable to write out a check. She has had intermittent weakness and fatigue.  She also has long standing insomnia and was on  remron until Dr Kimberly English gave her Ambien 10mg  to take hs in mid sept. She likes the Ambien. She has remained very focused on her sleep issues and Nausea for the last 1+ month.  Labs were done in our office and CBC and UA were (-). BMET came back after 9 pm last night. Na 124, K 3.3, Cl 85, CO2 25, BUN 10, Cr 0.6, glucose 93, Ca 9.4. I called her at home and discussed the results with her. It was hard to talk with her as she was focused on her Remus Loffler, and the fact that without it she would only sleep for 3 hrs. I tried to get her to focus on the sodium and re-iterated what Kimberly English told her = No Ambien. She had just taken a Remeron and she was in no condition to drive. Her family was away, so I did not recommend going to the ED at that moment. I did discuss plans with pt of head to the ED now via EMS - she did not want this and i did not want to force this issue. We decided to start to cut back on free water as she said she has been drinking lots of water, ginger ale and other fluids. She will hold the Avalide (as HCT will make low Na worse) through the weekend and I was to call her back this am and continue to discuss options. If she did end up getting admitted she would have needed to be seen monday with repeat labs. Shortly after this phone call her son Kimberly English [phone] called me and we discussed plan and treatment options. Shortly afterwards the anxiety of my phone call started to scare Kimberly English and she developed CP and called 911. She was transported to [facility] W/Up was (-) for cardiac issues and Hyponatremia/Hypokalemia confirmed. She was treated with 1L NS fluid bolus and 2 K pills. I was called for inpatient admission  Her CP was moderately severe and nonradiating - centrally located With heaviness. Her symptoms started @ 10 PM and resolved without intervention just as she was arriving to the emergency department via EMS. The patient did receive aspirin 324 mg on route. No NTG     Hospital  Course:  She was given a decent fluid bolus in ED and started on IVF with Potassium. One dose oral potassium and one dose PO Lasix was given. She was [employer] restricted and her Avalide was D/ced. Her Na and K had improved to Normal range within 24 hrs of admission. Her TSH was normal. She had some anxiety driven CP and EKG/Tele/Trop Is were all (-). No Angina was appreciated and she is considered low risk for a CV event. She remains Very focused on her Insomnia and her Ambien Rx. Mentally she seems strong and fine but still recounting episodes here in the hospital of intermittent confusion. CCT ordered and obtained b/c as I was all set to D/C her this am she complained  of another confusion episode last evening. The CCT IMPRESSION:  No intracranial hemorrhage or CT evidence of large acute infarct.  Remote small anterior right cerebellar infarct.  Mild global atrophy.  Na 136 and K 3.5 today.  I called back @ 12 noon 06/16/12 and discussed with Nursing that care coordination, son Onalee Hua and pt have agreed to [facility] SN[facility] for [facility] term rehab. I had originally planned to set up HHPT b/c of the global weakness. See PT notes.  B/c of nausea on admit a KUB was done and fine. After electrolytes improved her Nausea was better and she has been eating well.  BP remained fine off the Avalide and she can follow up quickly in our office to see what BP med if any is needed going into the future. Aldactone or lasix may be needed for her LE Edema. Keep legs elevated and ?stockings?  Weakness/FTT/Confusion/Dizzy/Nausea - due to Hypokalemia and Hyponatremia - Sxs much better than admit  #s Better 06/15/12. This am labs P. Stay off Avalide, continue Free water restriction.  Keep Moving and functioning.  D/c today anticipated.  Depression - Mood wise appears OK/fine with intermittent severe anxiety which may be driving some of her confusion. .  Anemia - Hbg 11.4-11.7 and OK. Will leave for outpt w/up if warranted.    Confusion every afternoon - including yesterday again - CCT was done to be sure no underlying pathology. She has a history of Trigeminal Neuralgia and Gamma Knife Rx @ 10 yrs ago at [facility].  DVT Prophylaxis was provided with lovenox.      Day of Discharge Exam BP 121/83  Pulse 76  Temp 98.1 F (36.7 C) (Oral)  Resp 16  Ht 5\' 2"  (1.575 m)  Wt 69.5 kg (153 lb 3.5 oz)  BMI 28.02 kg/m2  SpO2 100%  Physical Exam: Patient is awake alert sitting in bed recognizes and knows me immediately. Good facial symmetry. No JVD or bruits. Lungs are clear. Cardiovascular exam is regular 2/6 systolic ejection murmur which is baseline. Abdomen is soft and nontender good bowel sounds. Back is kyphotic. Extremities with trace edema intact distal pulses. Calves soft and nontender. Neurologically she is completely normal. Discharge Labs:  [facility] 06/16/12 0700 06/15/12 0615  NA 136 135  K 3.5 4.0  CL 101 102  CO2 26 25  GLUCOSE 91 78  BUN 10 14  CREATININE 0.59 0.68  CALCIUM 9.1 8.9  MG -- --  PHOS -- --   No results found for this basename: AST:2,ALT:2,ALKPHOS:2,BILITOT:2,PROT:2,ALBUMIN:2 in the last 72 hours  Basename 06/16/12 0700 06/15/12 0615  WBC 5.2 4.6  NEUTROABS -- --  HGB 11.9* 11.4*  HCT 34.4* 33.3*  MCV 89.6 89.0  PLT 191 194    Basename 06/14/12 2126 06/14/12 1525 06/14/12 0828  CKTOTAL -- -- --  CKMB -- -- --  CKMBINDEX -- -- --  TROPONINI <0.30 <0.30 <0.30    Basename 06/14/12 0827  TSH 1.969  T4TOTAL --  T3FREE --  THYROIDAB --   No results found for this basename: VITAMINB12:2,FOLATE:2,FERRITIN:2,TIBC:2,IRON:2,RETICCTPCT:2 in the last 72 hours  Discharge instructions:  she'll go to skilled nursing as she is not felt as though she is safe at home has become debilitated from this episode but has excellent potential for complete restoration radials and independence  Disposition: Skilled nursing  Follow-up Appts: Follow-up with Dr. Jacky English at [facility] in 1 week after skilled nursing.  Call for appointment.  Condition on Discharge: Improved and stable  Tests Needing Follow-up: Close monitoring of her be met and blood pressure for potential reinitiation of antihypertensive and/or the need to better manage her lower [place] edema she becomes more mobile  Signed: Lindwood Mogel A 06/17/2012, 7:03 AM

## 2012-06-17 NOTE — Progress Notes (Addendum)
D/c orders received;IV removed with gauze on, pt remains in stable condition, pt meds and instructions reviewed and given to pt; pt d/c to [facility], son Onalee Hua, will take; attempted to call report to [facility], unable to reach nurse

## 2012-06-18 DIAGNOSIS — F329 Major depressive disorder, single episode, unspecified: Secondary | ICD-10-CM | POA: Diagnosis not present

## 2012-06-18 DIAGNOSIS — I10 Essential (primary) hypertension: Secondary | ICD-10-CM | POA: Diagnosis not present

## 2012-06-18 DIAGNOSIS — E871 Hypo-osmolality and hyponatremia: Secondary | ICD-10-CM | POA: Diagnosis not present

## 2012-07-09 DIAGNOSIS — F329 Major depressive disorder, single episode, unspecified: Secondary | ICD-10-CM | POA: Diagnosis not present

## 2012-07-09 DIAGNOSIS — I10 Essential (primary) hypertension: Secondary | ICD-10-CM | POA: Diagnosis not present

## 2012-07-09 DIAGNOSIS — J301 Allergic rhinitis due to pollen: Secondary | ICD-10-CM | POA: Diagnosis not present

## 2012-07-11 DIAGNOSIS — F329 Major depressive disorder, single episode, unspecified: Secondary | ICD-10-CM | POA: Diagnosis not present

## 2012-07-11 DIAGNOSIS — M1990 Unspecified osteoarthritis, unspecified site: Secondary | ICD-10-CM | POA: Diagnosis not present

## 2012-07-11 DIAGNOSIS — G500 Trigeminal neuralgia: Secondary | ICD-10-CM | POA: Diagnosis not present

## 2012-07-11 DIAGNOSIS — I10 Essential (primary) hypertension: Secondary | ICD-10-CM | POA: Diagnosis not present

## 2012-07-11 DIAGNOSIS — R269 Unspecified abnormalities of gait and mobility: Secondary | ICD-10-CM | POA: Diagnosis not present

## 2012-07-15 DIAGNOSIS — R269 Unspecified abnormalities of gait and mobility: Secondary | ICD-10-CM | POA: Diagnosis not present

## 2012-07-15 DIAGNOSIS — F329 Major depressive disorder, single episode, unspecified: Secondary | ICD-10-CM | POA: Diagnosis not present

## 2012-07-15 DIAGNOSIS — M1990 Unspecified osteoarthritis, unspecified site: Secondary | ICD-10-CM | POA: Diagnosis not present

## 2012-07-15 DIAGNOSIS — G500 Trigeminal neuralgia: Secondary | ICD-10-CM | POA: Diagnosis not present

## 2012-07-15 DIAGNOSIS — I10 Essential (primary) hypertension: Secondary | ICD-10-CM | POA: Diagnosis not present

## 2012-07-16 DIAGNOSIS — E871 Hypo-osmolality and hyponatremia: Secondary | ICD-10-CM | POA: Diagnosis not present

## 2012-07-16 DIAGNOSIS — N189 Chronic kidney disease, unspecified: Secondary | ICD-10-CM | POA: Diagnosis not present

## 2012-07-17 DIAGNOSIS — R269 Unspecified abnormalities of gait and mobility: Secondary | ICD-10-CM | POA: Diagnosis not present

## 2012-07-17 DIAGNOSIS — G500 Trigeminal neuralgia: Secondary | ICD-10-CM | POA: Diagnosis not present

## 2012-07-17 DIAGNOSIS — F329 Major depressive disorder, single episode, unspecified: Secondary | ICD-10-CM | POA: Diagnosis not present

## 2012-07-17 DIAGNOSIS — I10 Essential (primary) hypertension: Secondary | ICD-10-CM | POA: Diagnosis not present

## 2012-07-17 DIAGNOSIS — M1990 Unspecified osteoarthritis, unspecified site: Secondary | ICD-10-CM | POA: Diagnosis not present

## 2012-07-18 DIAGNOSIS — R269 Unspecified abnormalities of gait and mobility: Secondary | ICD-10-CM | POA: Diagnosis not present

## 2012-07-18 DIAGNOSIS — I10 Essential (primary) hypertension: Secondary | ICD-10-CM | POA: Diagnosis not present

## 2012-07-18 DIAGNOSIS — F329 Major depressive disorder, single episode, unspecified: Secondary | ICD-10-CM | POA: Diagnosis not present

## 2012-07-18 DIAGNOSIS — M1990 Unspecified osteoarthritis, unspecified site: Secondary | ICD-10-CM | POA: Diagnosis not present

## 2012-07-18 DIAGNOSIS — G500 Trigeminal neuralgia: Secondary | ICD-10-CM | POA: Diagnosis not present

## 2012-07-23 DIAGNOSIS — I10 Essential (primary) hypertension: Secondary | ICD-10-CM | POA: Diagnosis not present

## 2012-07-23 DIAGNOSIS — R269 Unspecified abnormalities of gait and mobility: Secondary | ICD-10-CM | POA: Diagnosis not present

## 2012-07-23 DIAGNOSIS — F329 Major depressive disorder, single episode, unspecified: Secondary | ICD-10-CM | POA: Diagnosis not present

## 2012-07-23 DIAGNOSIS — M1990 Unspecified osteoarthritis, unspecified site: Secondary | ICD-10-CM | POA: Diagnosis not present

## 2012-07-23 DIAGNOSIS — G500 Trigeminal neuralgia: Secondary | ICD-10-CM | POA: Diagnosis not present

## 2012-07-25 DIAGNOSIS — M1990 Unspecified osteoarthritis, unspecified site: Secondary | ICD-10-CM | POA: Diagnosis not present

## 2012-07-25 DIAGNOSIS — I10 Essential (primary) hypertension: Secondary | ICD-10-CM | POA: Diagnosis not present

## 2012-07-25 DIAGNOSIS — G500 Trigeminal neuralgia: Secondary | ICD-10-CM | POA: Diagnosis not present

## 2012-07-25 DIAGNOSIS — R269 Unspecified abnormalities of gait and mobility: Secondary | ICD-10-CM | POA: Diagnosis not present

## 2012-07-25 DIAGNOSIS — F329 Major depressive disorder, single episode, unspecified: Secondary | ICD-10-CM | POA: Diagnosis not present

## 2012-07-29 DIAGNOSIS — G500 Trigeminal neuralgia: Secondary | ICD-10-CM | POA: Diagnosis not present

## 2012-07-29 DIAGNOSIS — R269 Unspecified abnormalities of gait and mobility: Secondary | ICD-10-CM | POA: Diagnosis not present

## 2012-07-29 DIAGNOSIS — F329 Major depressive disorder, single episode, unspecified: Secondary | ICD-10-CM | POA: Diagnosis not present

## 2012-07-29 DIAGNOSIS — M1990 Unspecified osteoarthritis, unspecified site: Secondary | ICD-10-CM | POA: Diagnosis not present

## 2012-07-29 DIAGNOSIS — I10 Essential (primary) hypertension: Secondary | ICD-10-CM | POA: Diagnosis not present

## 2012-08-01 DIAGNOSIS — R269 Unspecified abnormalities of gait and mobility: Secondary | ICD-10-CM | POA: Diagnosis not present

## 2012-08-01 DIAGNOSIS — F329 Major depressive disorder, single episode, unspecified: Secondary | ICD-10-CM | POA: Diagnosis not present

## 2012-08-01 DIAGNOSIS — I10 Essential (primary) hypertension: Secondary | ICD-10-CM | POA: Diagnosis not present

## 2012-08-01 DIAGNOSIS — G500 Trigeminal neuralgia: Secondary | ICD-10-CM | POA: Diagnosis not present

## 2012-08-01 DIAGNOSIS — M1990 Unspecified osteoarthritis, unspecified site: Secondary | ICD-10-CM | POA: Diagnosis not present

## 2012-08-08 DIAGNOSIS — G500 Trigeminal neuralgia: Secondary | ICD-10-CM | POA: Diagnosis not present

## 2012-08-08 DIAGNOSIS — M1990 Unspecified osteoarthritis, unspecified site: Secondary | ICD-10-CM | POA: Diagnosis not present

## 2012-08-08 DIAGNOSIS — I10 Essential (primary) hypertension: Secondary | ICD-10-CM | POA: Diagnosis not present

## 2012-08-08 DIAGNOSIS — R269 Unspecified abnormalities of gait and mobility: Secondary | ICD-10-CM | POA: Diagnosis not present

## 2012-08-08 DIAGNOSIS — E871 Hypo-osmolality and hyponatremia: Secondary | ICD-10-CM | POA: Diagnosis not present

## 2012-08-08 DIAGNOSIS — F329 Major depressive disorder, single episode, unspecified: Secondary | ICD-10-CM | POA: Diagnosis not present

## 2012-08-08 DIAGNOSIS — G4700 Insomnia, unspecified: Secondary | ICD-10-CM | POA: Diagnosis not present

## 2012-08-12 DIAGNOSIS — I10 Essential (primary) hypertension: Secondary | ICD-10-CM | POA: Diagnosis not present

## 2012-08-12 DIAGNOSIS — R269 Unspecified abnormalities of gait and mobility: Secondary | ICD-10-CM | POA: Diagnosis not present

## 2012-08-12 DIAGNOSIS — M1990 Unspecified osteoarthritis, unspecified site: Secondary | ICD-10-CM | POA: Diagnosis not present

## 2012-08-12 DIAGNOSIS — G500 Trigeminal neuralgia: Secondary | ICD-10-CM | POA: Diagnosis not present

## 2012-08-12 DIAGNOSIS — F329 Major depressive disorder, single episode, unspecified: Secondary | ICD-10-CM | POA: Diagnosis not present

## 2012-08-13 DIAGNOSIS — G500 Trigeminal neuralgia: Secondary | ICD-10-CM | POA: Diagnosis not present

## 2012-08-13 DIAGNOSIS — F329 Major depressive disorder, single episode, unspecified: Secondary | ICD-10-CM | POA: Diagnosis not present

## 2012-08-13 DIAGNOSIS — M1990 Unspecified osteoarthritis, unspecified site: Secondary | ICD-10-CM | POA: Diagnosis not present

## 2012-08-13 DIAGNOSIS — I10 Essential (primary) hypertension: Secondary | ICD-10-CM | POA: Diagnosis not present

## 2012-08-13 DIAGNOSIS — R269 Unspecified abnormalities of gait and mobility: Secondary | ICD-10-CM | POA: Diagnosis not present

## 2012-08-14 DIAGNOSIS — F329 Major depressive disorder, single episode, unspecified: Secondary | ICD-10-CM | POA: Diagnosis not present

## 2012-08-14 DIAGNOSIS — I10 Essential (primary) hypertension: Secondary | ICD-10-CM | POA: Diagnosis not present

## 2012-08-14 DIAGNOSIS — M1990 Unspecified osteoarthritis, unspecified site: Secondary | ICD-10-CM | POA: Diagnosis not present

## 2012-08-14 DIAGNOSIS — G500 Trigeminal neuralgia: Secondary | ICD-10-CM | POA: Diagnosis not present

## 2012-08-14 DIAGNOSIS — R269 Unspecified abnormalities of gait and mobility: Secondary | ICD-10-CM | POA: Diagnosis not present

## 2012-08-18 DIAGNOSIS — I10 Essential (primary) hypertension: Secondary | ICD-10-CM | POA: Diagnosis not present

## 2012-08-18 DIAGNOSIS — M1990 Unspecified osteoarthritis, unspecified site: Secondary | ICD-10-CM | POA: Diagnosis not present

## 2012-08-18 DIAGNOSIS — R269 Unspecified abnormalities of gait and mobility: Secondary | ICD-10-CM | POA: Diagnosis not present

## 2012-08-18 DIAGNOSIS — F329 Major depressive disorder, single episode, unspecified: Secondary | ICD-10-CM | POA: Diagnosis not present

## 2012-08-18 DIAGNOSIS — G500 Trigeminal neuralgia: Secondary | ICD-10-CM | POA: Diagnosis not present

## 2012-08-21 DIAGNOSIS — I10 Essential (primary) hypertension: Secondary | ICD-10-CM | POA: Diagnosis not present

## 2012-08-21 DIAGNOSIS — M1990 Unspecified osteoarthritis, unspecified site: Secondary | ICD-10-CM | POA: Diagnosis not present

## 2012-08-21 DIAGNOSIS — F329 Major depressive disorder, single episode, unspecified: Secondary | ICD-10-CM | POA: Diagnosis not present

## 2012-08-21 DIAGNOSIS — G500 Trigeminal neuralgia: Secondary | ICD-10-CM | POA: Diagnosis not present

## 2012-08-21 DIAGNOSIS — R269 Unspecified abnormalities of gait and mobility: Secondary | ICD-10-CM | POA: Diagnosis not present

## 2012-08-22 DIAGNOSIS — F329 Major depressive disorder, single episode, unspecified: Secondary | ICD-10-CM | POA: Diagnosis not present

## 2012-08-22 DIAGNOSIS — R269 Unspecified abnormalities of gait and mobility: Secondary | ICD-10-CM | POA: Diagnosis not present

## 2012-08-22 DIAGNOSIS — M1990 Unspecified osteoarthritis, unspecified site: Secondary | ICD-10-CM | POA: Diagnosis not present

## 2012-08-22 DIAGNOSIS — G500 Trigeminal neuralgia: Secondary | ICD-10-CM | POA: Diagnosis not present

## 2012-08-22 DIAGNOSIS — I10 Essential (primary) hypertension: Secondary | ICD-10-CM | POA: Diagnosis not present

## 2012-08-25 DIAGNOSIS — G500 Trigeminal neuralgia: Secondary | ICD-10-CM | POA: Diagnosis not present

## 2012-08-25 DIAGNOSIS — M1990 Unspecified osteoarthritis, unspecified site: Secondary | ICD-10-CM | POA: Diagnosis not present

## 2012-08-25 DIAGNOSIS — F329 Major depressive disorder, single episode, unspecified: Secondary | ICD-10-CM | POA: Diagnosis not present

## 2012-08-25 DIAGNOSIS — R269 Unspecified abnormalities of gait and mobility: Secondary | ICD-10-CM | POA: Diagnosis not present

## 2012-08-25 DIAGNOSIS — I10 Essential (primary) hypertension: Secondary | ICD-10-CM | POA: Diagnosis not present

## 2012-09-05 DIAGNOSIS — G500 Trigeminal neuralgia: Secondary | ICD-10-CM | POA: Diagnosis not present

## 2012-09-05 DIAGNOSIS — R269 Unspecified abnormalities of gait and mobility: Secondary | ICD-10-CM | POA: Diagnosis not present

## 2012-09-05 DIAGNOSIS — F329 Major depressive disorder, single episode, unspecified: Secondary | ICD-10-CM | POA: Diagnosis not present

## 2012-09-05 DIAGNOSIS — I10 Essential (primary) hypertension: Secondary | ICD-10-CM | POA: Diagnosis not present

## 2012-09-05 DIAGNOSIS — M1990 Unspecified osteoarthritis, unspecified site: Secondary | ICD-10-CM | POA: Diagnosis not present

## 2012-10-01 DIAGNOSIS — H4010X0 Unspecified open-angle glaucoma, stage unspecified: Secondary | ICD-10-CM | POA: Diagnosis not present

## 2012-10-21 DIAGNOSIS — F329 Major depressive disorder, single episode, unspecified: Secondary | ICD-10-CM | POA: Diagnosis not present

## 2012-10-21 DIAGNOSIS — R82998 Other abnormal findings in urine: Secondary | ICD-10-CM | POA: Diagnosis not present

## 2012-10-21 DIAGNOSIS — I10 Essential (primary) hypertension: Secondary | ICD-10-CM | POA: Diagnosis not present

## 2012-10-21 DIAGNOSIS — M810 Age-related osteoporosis without current pathological fracture: Secondary | ICD-10-CM | POA: Diagnosis not present

## 2012-10-22 DIAGNOSIS — R82998 Other abnormal findings in urine: Secondary | ICD-10-CM | POA: Diagnosis not present

## 2012-10-23 DIAGNOSIS — J069 Acute upper respiratory infection, unspecified: Secondary | ICD-10-CM | POA: Diagnosis not present

## 2012-10-23 DIAGNOSIS — R05 Cough: Secondary | ICD-10-CM | POA: Diagnosis not present

## 2012-10-23 DIAGNOSIS — R509 Fever, unspecified: Secondary | ICD-10-CM | POA: Diagnosis not present

## 2012-10-23 DIAGNOSIS — I10 Essential (primary) hypertension: Secondary | ICD-10-CM | POA: Diagnosis not present

## 2012-10-29 DIAGNOSIS — Z0000 Encounter for general adult medical examination without abnormal findings: Secondary | ICD-10-CM | POA: Diagnosis not present

## 2012-10-29 DIAGNOSIS — N189 Chronic kidney disease, unspecified: Secondary | ICD-10-CM | POA: Diagnosis not present

## 2012-10-29 DIAGNOSIS — R609 Edema, unspecified: Secondary | ICD-10-CM | POA: Diagnosis not present

## 2012-10-29 DIAGNOSIS — I10 Essential (primary) hypertension: Secondary | ICD-10-CM | POA: Diagnosis not present

## 2012-11-04 DIAGNOSIS — I10 Essential (primary) hypertension: Secondary | ICD-10-CM | POA: Diagnosis not present

## 2012-11-04 DIAGNOSIS — N390 Urinary tract infection, site not specified: Secondary | ICD-10-CM | POA: Diagnosis not present

## 2012-11-04 DIAGNOSIS — R509 Fever, unspecified: Secondary | ICD-10-CM | POA: Diagnosis not present

## 2012-11-04 DIAGNOSIS — R82998 Other abnormal findings in urine: Secondary | ICD-10-CM | POA: Diagnosis not present

## 2012-11-04 DIAGNOSIS — R05 Cough: Secondary | ICD-10-CM | POA: Diagnosis not present

## 2012-11-17 DIAGNOSIS — Z1212 Encounter for screening for malignant neoplasm of rectum: Secondary | ICD-10-CM | POA: Diagnosis not present

## 2013-01-29 DIAGNOSIS — H04129 Dry eye syndrome of unspecified lacrimal gland: Secondary | ICD-10-CM | POA: Diagnosis not present

## 2013-01-29 DIAGNOSIS — H4010X0 Unspecified open-angle glaucoma, stage unspecified: Secondary | ICD-10-CM | POA: Diagnosis not present

## 2013-01-29 DIAGNOSIS — H26499 Other secondary cataract, unspecified eye: Secondary | ICD-10-CM | POA: Diagnosis not present

## 2013-01-29 DIAGNOSIS — H43399 Other vitreous opacities, unspecified eye: Secondary | ICD-10-CM | POA: Diagnosis not present

## 2013-02-24 DIAGNOSIS — L570 Actinic keratosis: Secondary | ICD-10-CM | POA: Diagnosis not present

## 2013-02-24 DIAGNOSIS — D235 Other benign neoplasm of skin of trunk: Secondary | ICD-10-CM | POA: Diagnosis not present

## 2013-03-31 ENCOUNTER — Other Ambulatory Visit: Payer: Self-pay | Admitting: Geriatric Medicine

## 2013-04-01 ENCOUNTER — Other Ambulatory Visit: Payer: Self-pay | Admitting: Geriatric Medicine

## 2013-04-15 DIAGNOSIS — J3489 Other specified disorders of nose and nasal sinuses: Secondary | ICD-10-CM | POA: Diagnosis not present

## 2013-04-15 DIAGNOSIS — Z6825 Body mass index (BMI) 25.0-25.9, adult: Secondary | ICD-10-CM | POA: Diagnosis not present

## 2013-04-15 DIAGNOSIS — I10 Essential (primary) hypertension: Secondary | ICD-10-CM | POA: Diagnosis not present

## 2013-04-15 DIAGNOSIS — E871 Hypo-osmolality and hyponatremia: Secondary | ICD-10-CM | POA: Diagnosis not present

## 2013-04-15 DIAGNOSIS — R350 Frequency of micturition: Secondary | ICD-10-CM | POA: Diagnosis not present

## 2013-04-15 DIAGNOSIS — R82998 Other abnormal findings in urine: Secondary | ICD-10-CM | POA: Diagnosis not present

## 2013-04-21 DIAGNOSIS — K089 Disorder of teeth and supporting structures, unspecified: Secondary | ICD-10-CM | POA: Diagnosis not present

## 2013-04-21 DIAGNOSIS — I10 Essential (primary) hypertension: Secondary | ICD-10-CM | POA: Diagnosis not present

## 2013-04-21 DIAGNOSIS — M279 Disease of jaws, unspecified: Secondary | ICD-10-CM | POA: Diagnosis not present

## 2013-05-06 DIAGNOSIS — R82998 Other abnormal findings in urine: Secondary | ICD-10-CM | POA: Diagnosis not present

## 2013-05-06 DIAGNOSIS — M1990 Unspecified osteoarthritis, unspecified site: Secondary | ICD-10-CM | POA: Diagnosis not present

## 2013-05-06 DIAGNOSIS — I10 Essential (primary) hypertension: Secondary | ICD-10-CM | POA: Diagnosis not present

## 2013-05-06 DIAGNOSIS — E871 Hypo-osmolality and hyponatremia: Secondary | ICD-10-CM | POA: Diagnosis not present

## 2013-05-06 DIAGNOSIS — Z6825 Body mass index (BMI) 25.0-25.9, adult: Secondary | ICD-10-CM | POA: Diagnosis not present

## 2013-05-06 DIAGNOSIS — Z1331 Encounter for screening for depression: Secondary | ICD-10-CM | POA: Diagnosis not present

## 2013-05-06 DIAGNOSIS — R944 Abnormal results of kidney function studies: Secondary | ICD-10-CM | POA: Diagnosis not present

## 2013-05-06 DIAGNOSIS — F329 Major depressive disorder, single episode, unspecified: Secondary | ICD-10-CM | POA: Diagnosis not present

## 2013-05-06 DIAGNOSIS — N189 Chronic kidney disease, unspecified: Secondary | ICD-10-CM | POA: Diagnosis not present

## 2013-05-06 DIAGNOSIS — G500 Trigeminal neuralgia: Secondary | ICD-10-CM | POA: Diagnosis not present

## 2013-05-25 DIAGNOSIS — Z23 Encounter for immunization: Secondary | ICD-10-CM | POA: Diagnosis not present

## 2013-10-28 DIAGNOSIS — M810 Age-related osteoporosis without current pathological fracture: Secondary | ICD-10-CM | POA: Diagnosis not present

## 2013-10-28 DIAGNOSIS — I10 Essential (primary) hypertension: Secondary | ICD-10-CM | POA: Diagnosis not present

## 2013-10-28 DIAGNOSIS — R809 Proteinuria, unspecified: Secondary | ICD-10-CM | POA: Diagnosis not present

## 2013-10-28 DIAGNOSIS — N189 Chronic kidney disease, unspecified: Secondary | ICD-10-CM | POA: Diagnosis not present

## 2013-10-28 DIAGNOSIS — R82998 Other abnormal findings in urine: Secondary | ICD-10-CM | POA: Diagnosis not present

## 2013-11-04 DIAGNOSIS — M1990 Unspecified osteoarthritis, unspecified site: Secondary | ICD-10-CM | POA: Diagnosis not present

## 2013-11-04 DIAGNOSIS — I10 Essential (primary) hypertension: Secondary | ICD-10-CM | POA: Diagnosis not present

## 2013-11-04 DIAGNOSIS — Z0000 Encounter for general adult medical examination without abnormal findings: Secondary | ICD-10-CM | POA: Diagnosis not present

## 2013-11-04 DIAGNOSIS — Z6825 Body mass index (BMI) 25.0-25.9, adult: Secondary | ICD-10-CM | POA: Diagnosis not present

## 2013-11-04 DIAGNOSIS — M810 Age-related osteoporosis without current pathological fracture: Secondary | ICD-10-CM | POA: Diagnosis not present

## 2013-11-04 DIAGNOSIS — G500 Trigeminal neuralgia: Secondary | ICD-10-CM | POA: Diagnosis not present

## 2013-11-04 DIAGNOSIS — F329 Major depressive disorder, single episode, unspecified: Secondary | ICD-10-CM | POA: Diagnosis not present

## 2013-11-12 DIAGNOSIS — Z1212 Encounter for screening for malignant neoplasm of rectum: Secondary | ICD-10-CM | POA: Diagnosis not present

## 2013-11-23 ENCOUNTER — Telehealth: Payer: Self-pay | Admitting: Neurology

## 2013-11-23 NOTE — Telephone Encounter (Signed)
Pt called.  She stated that the whole left side of her face is hurting.  She thinks it is due to a tooth, but Dr. Nicki Reaper thought it might be due to the nerve she had clipped about 10 + years ago when she saw Dr. Brett Fairy last.  Her PCP is out of the office until April 8th and she didn't know what to do.  She stated she is on a lot of medication so she doesn't know if there is anything she can take.  Please call.  Thank you

## 2013-11-23 NOTE — Telephone Encounter (Signed)
LMVM for pt that I was returning call.

## 2013-11-25 NOTE — Telephone Encounter (Signed)
I called pt and she relayed that she did go to dentist and they pulled loose tooth.  Still sore, but hopes this is it.   I told her that if continues she would need to see pcp, then be referred again since its been more then 3 yrs.  She will do so.

## 2014-05-07 DIAGNOSIS — Z6825 Body mass index (BMI) 25.0-25.9, adult: Secondary | ICD-10-CM | POA: Diagnosis not present

## 2014-05-07 DIAGNOSIS — I10 Essential (primary) hypertension: Secondary | ICD-10-CM | POA: Diagnosis not present

## 2014-05-07 DIAGNOSIS — Z1331 Encounter for screening for depression: Secondary | ICD-10-CM | POA: Diagnosis not present

## 2014-05-07 DIAGNOSIS — R609 Edema, unspecified: Secondary | ICD-10-CM | POA: Diagnosis not present

## 2014-05-07 DIAGNOSIS — M1990 Unspecified osteoarthritis, unspecified site: Secondary | ICD-10-CM | POA: Diagnosis not present

## 2014-05-07 DIAGNOSIS — G500 Trigeminal neuralgia: Secondary | ICD-10-CM | POA: Diagnosis not present

## 2014-05-19 DIAGNOSIS — Z23 Encounter for immunization: Secondary | ICD-10-CM | POA: Diagnosis not present

## 2014-07-28 IMAGING — CR DG CHEST 2V
2 series · 2 of 2 positions shown · non-contrast
Comparison: None.

CLINICAL DATA: Left chest pain, shortness of breath

CHEST - 2 VIEW

[w chest pa]
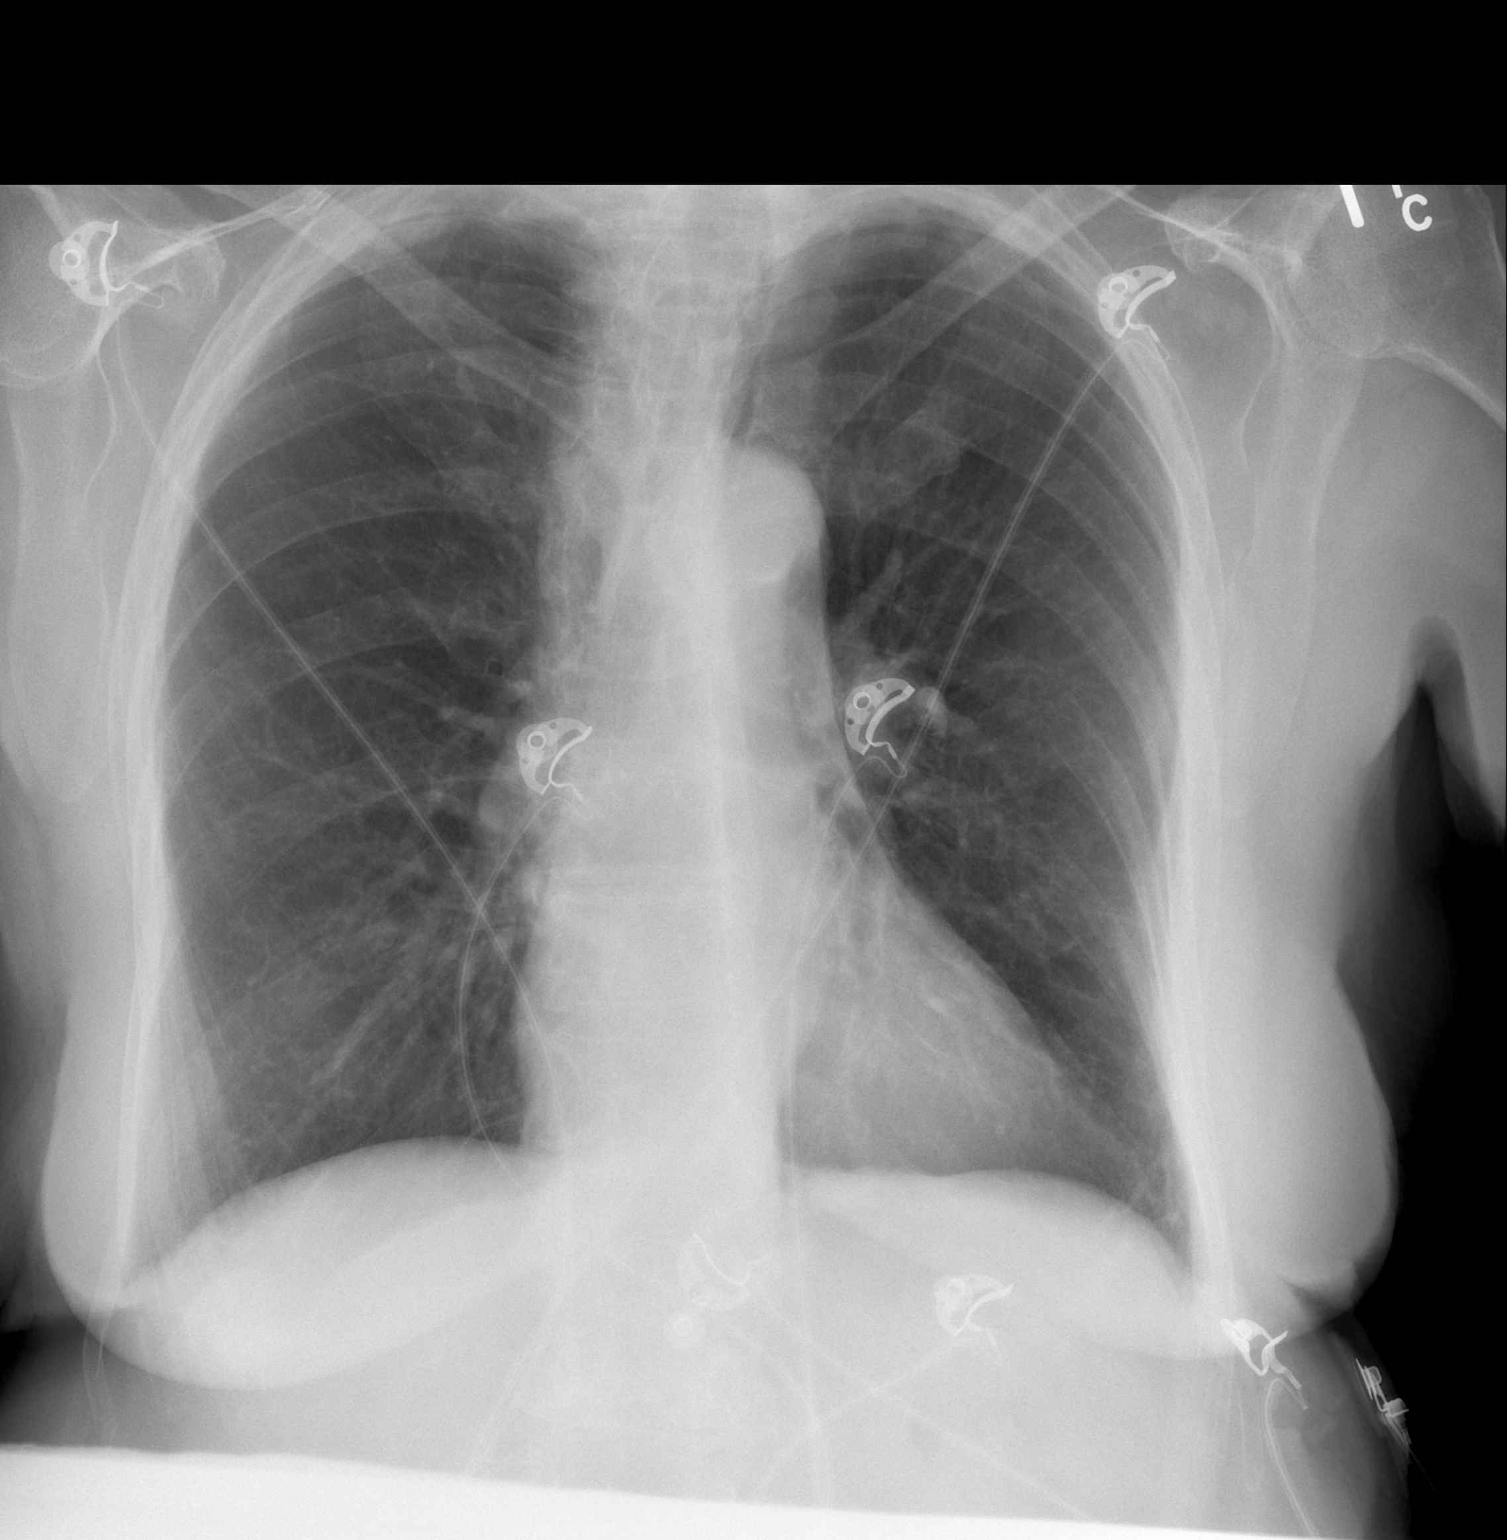

[w chest lat]
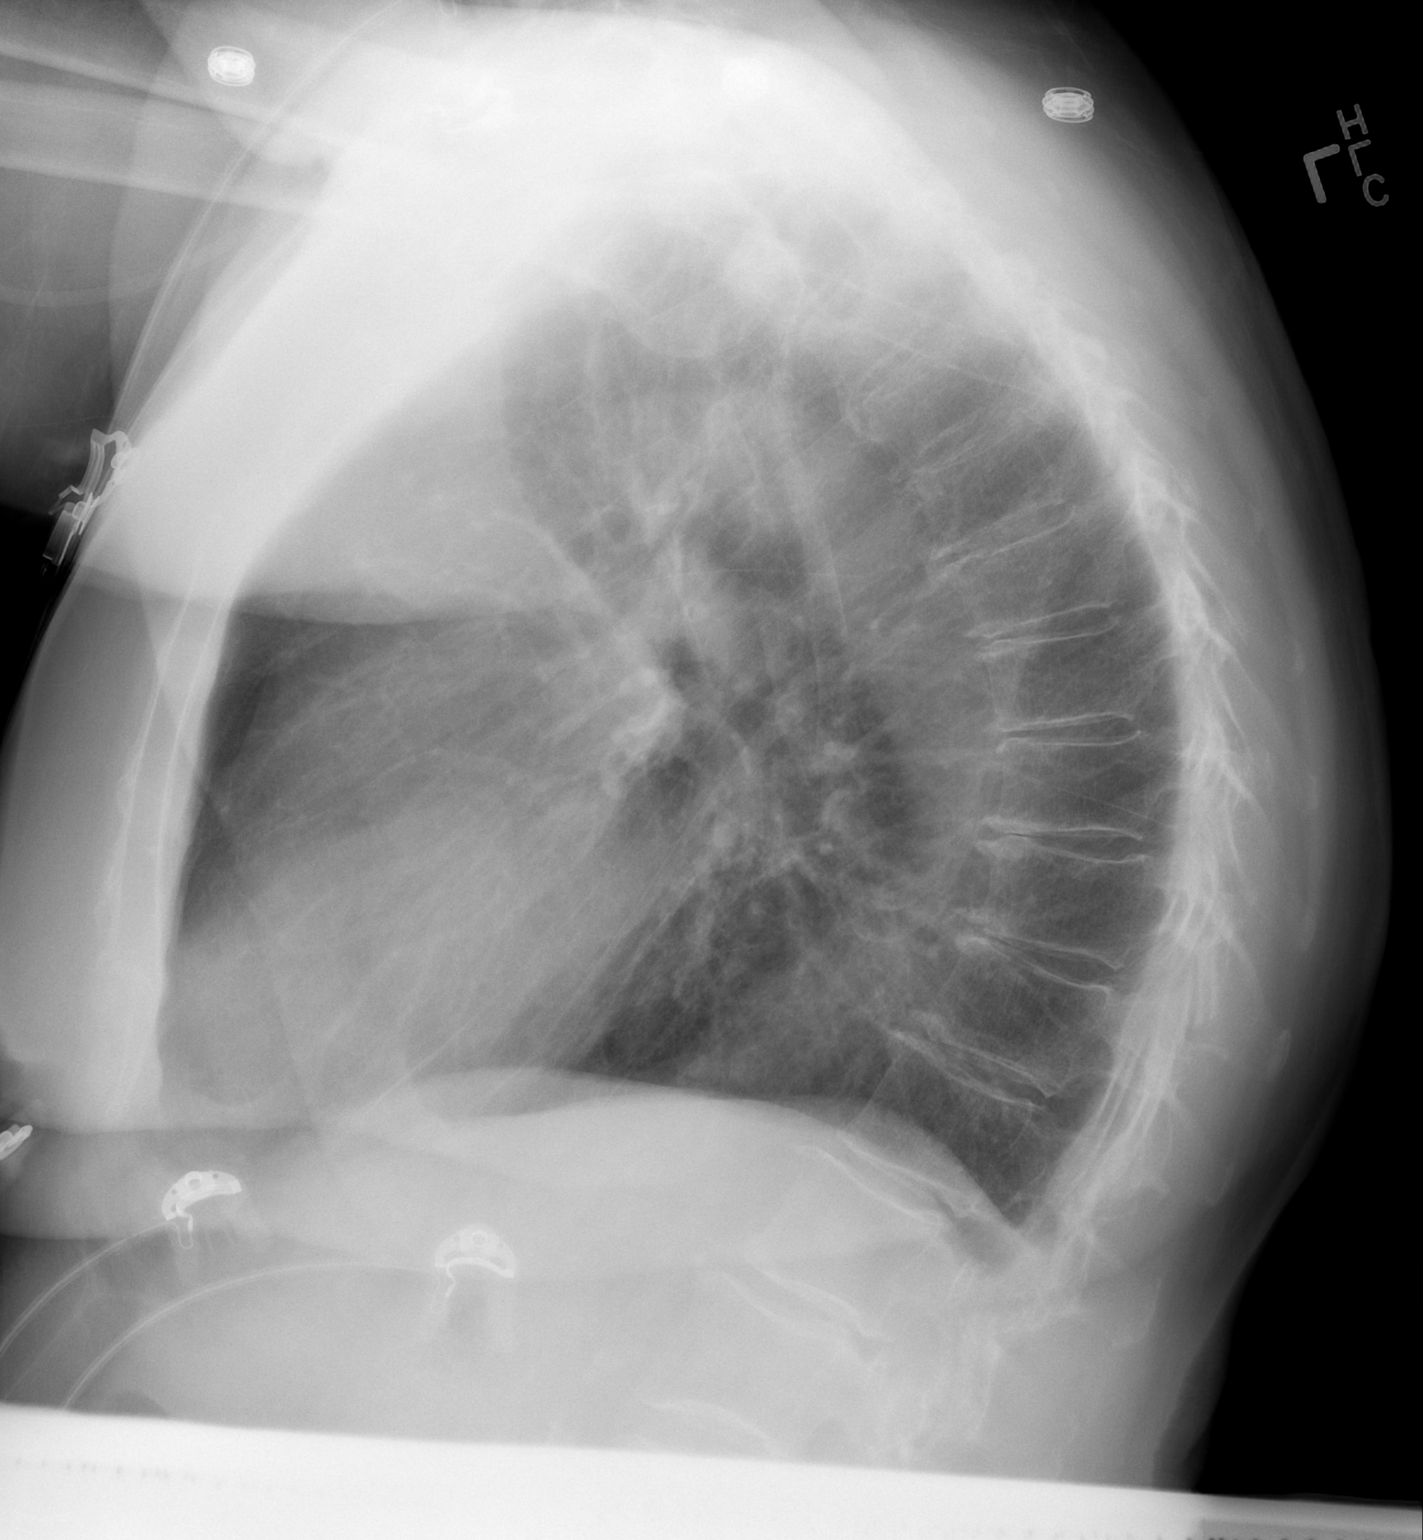

[2 of 2 positions shown; findings below may reference images not displayed]

FINDINGS: Chronic interstitial markings/emphysematous changes. No
pleural effusion or pneumothorax.

Cardiomediastinal silhouette is within normal limits.

Degenerative changes of the visualized thoracolumbar spine.
Exaggerated thoracic kyphosis.
IMPRESSION: No evidence of acute cardiopulmonary disease.

## 2014-09-15 DIAGNOSIS — L720 Epidermal cyst: Secondary | ICD-10-CM | POA: Diagnosis not present

## 2014-09-15 DIAGNOSIS — L02232 Carbuncle of back [any part, except buttock]: Secondary | ICD-10-CM | POA: Diagnosis not present

## 2014-10-06 DIAGNOSIS — L720 Epidermal cyst: Secondary | ICD-10-CM | POA: Diagnosis not present

## 2014-10-07 DIAGNOSIS — Z6827 Body mass index (BMI) 27.0-27.9, adult: Secondary | ICD-10-CM | POA: Diagnosis not present

## 2014-10-07 DIAGNOSIS — Z1389 Encounter for screening for other disorder: Secondary | ICD-10-CM | POA: Diagnosis not present

## 2014-10-07 DIAGNOSIS — I10 Essential (primary) hypertension: Secondary | ICD-10-CM | POA: Diagnosis not present

## 2014-10-07 DIAGNOSIS — F329 Major depressive disorder, single episode, unspecified: Secondary | ICD-10-CM | POA: Diagnosis not present

## 2014-10-07 DIAGNOSIS — M1990 Unspecified osteoarthritis, unspecified site: Secondary | ICD-10-CM | POA: Diagnosis not present

## 2014-10-07 DIAGNOSIS — R5383 Other fatigue: Secondary | ICD-10-CM | POA: Diagnosis not present

## 2014-10-07 DIAGNOSIS — R609 Edema, unspecified: Secondary | ICD-10-CM | POA: Diagnosis not present

## 2014-10-07 DIAGNOSIS — M810 Age-related osteoporosis without current pathological fracture: Secondary | ICD-10-CM | POA: Diagnosis not present

## 2014-10-29 DIAGNOSIS — I10 Essential (primary) hypertension: Secondary | ICD-10-CM | POA: Diagnosis not present

## 2014-10-29 DIAGNOSIS — Z79899 Other long term (current) drug therapy: Secondary | ICD-10-CM | POA: Diagnosis not present

## 2014-10-29 DIAGNOSIS — N390 Urinary tract infection, site not specified: Secondary | ICD-10-CM | POA: Diagnosis not present

## 2014-10-29 DIAGNOSIS — R8290 Unspecified abnormal findings in urine: Secondary | ICD-10-CM | POA: Diagnosis not present

## 2014-10-29 DIAGNOSIS — M810 Age-related osteoporosis without current pathological fracture: Secondary | ICD-10-CM | POA: Diagnosis not present

## 2014-11-12 DIAGNOSIS — Z1389 Encounter for screening for other disorder: Secondary | ICD-10-CM | POA: Diagnosis not present

## 2014-11-12 DIAGNOSIS — Z0000 Encounter for general adult medical examination without abnormal findings: Secondary | ICD-10-CM | POA: Diagnosis not present

## 2014-11-12 DIAGNOSIS — F329 Major depressive disorder, single episode, unspecified: Secondary | ICD-10-CM | POA: Diagnosis not present

## 2014-11-12 DIAGNOSIS — M810 Age-related osteoporosis without current pathological fracture: Secondary | ICD-10-CM | POA: Diagnosis not present

## 2014-11-12 DIAGNOSIS — G500 Trigeminal neuralgia: Secondary | ICD-10-CM | POA: Diagnosis not present

## 2014-11-12 DIAGNOSIS — Z6826 Body mass index (BMI) 26.0-26.9, adult: Secondary | ICD-10-CM | POA: Diagnosis not present

## 2014-11-12 DIAGNOSIS — M1990 Unspecified osteoarthritis, unspecified site: Secondary | ICD-10-CM | POA: Diagnosis not present

## 2014-11-12 DIAGNOSIS — Z23 Encounter for immunization: Secondary | ICD-10-CM | POA: Diagnosis not present

## 2014-11-12 DIAGNOSIS — I10 Essential (primary) hypertension: Secondary | ICD-10-CM | POA: Diagnosis not present

## 2014-11-15 DIAGNOSIS — Z1212 Encounter for screening for malignant neoplasm of rectum: Secondary | ICD-10-CM | POA: Diagnosis not present

## 2015-05-11 DIAGNOSIS — F329 Major depressive disorder, single episode, unspecified: Secondary | ICD-10-CM | POA: Diagnosis not present

## 2015-05-11 DIAGNOSIS — K219 Gastro-esophageal reflux disease without esophagitis: Secondary | ICD-10-CM | POA: Diagnosis not present

## 2015-05-11 DIAGNOSIS — Z23 Encounter for immunization: Secondary | ICD-10-CM | POA: Diagnosis not present

## 2015-05-11 DIAGNOSIS — M1990 Unspecified osteoarthritis, unspecified site: Secondary | ICD-10-CM | POA: Diagnosis not present

## 2015-05-11 DIAGNOSIS — N390 Urinary tract infection, site not specified: Secondary | ICD-10-CM | POA: Diagnosis not present

## 2015-05-11 DIAGNOSIS — M810 Age-related osteoporosis without current pathological fracture: Secondary | ICD-10-CM | POA: Diagnosis not present

## 2015-05-11 DIAGNOSIS — R350 Frequency of micturition: Secondary | ICD-10-CM | POA: Diagnosis not present

## 2015-05-11 DIAGNOSIS — I10 Essential (primary) hypertension: Secondary | ICD-10-CM | POA: Diagnosis not present

## 2015-11-21 DIAGNOSIS — I10 Essential (primary) hypertension: Secondary | ICD-10-CM | POA: Diagnosis not present

## 2015-11-21 DIAGNOSIS — M810 Age-related osteoporosis without current pathological fracture: Secondary | ICD-10-CM | POA: Diagnosis not present

## 2015-11-28 DIAGNOSIS — Z1389 Encounter for screening for other disorder: Secondary | ICD-10-CM | POA: Diagnosis not present

## 2015-11-28 DIAGNOSIS — M810 Age-related osteoporosis without current pathological fracture: Secondary | ICD-10-CM | POA: Diagnosis not present

## 2015-11-28 DIAGNOSIS — E871 Hypo-osmolality and hyponatremia: Secondary | ICD-10-CM | POA: Diagnosis not present

## 2015-11-28 DIAGNOSIS — F329 Major depressive disorder, single episode, unspecified: Secondary | ICD-10-CM | POA: Diagnosis not present

## 2015-11-28 DIAGNOSIS — K219 Gastro-esophageal reflux disease without esophagitis: Secondary | ICD-10-CM | POA: Diagnosis not present

## 2015-11-28 DIAGNOSIS — R350 Frequency of micturition: Secondary | ICD-10-CM | POA: Diagnosis not present

## 2015-11-28 DIAGNOSIS — G500 Trigeminal neuralgia: Secondary | ICD-10-CM | POA: Diagnosis not present

## 2015-11-28 DIAGNOSIS — I10 Essential (primary) hypertension: Secondary | ICD-10-CM | POA: Diagnosis not present

## 2015-11-28 DIAGNOSIS — Z6827 Body mass index (BMI) 27.0-27.9, adult: Secondary | ICD-10-CM | POA: Diagnosis not present

## 2015-11-28 DIAGNOSIS — Z0000 Encounter for general adult medical examination without abnormal findings: Secondary | ICD-10-CM | POA: Diagnosis not present

## 2015-11-28 DIAGNOSIS — R609 Edema, unspecified: Secondary | ICD-10-CM | POA: Diagnosis not present

## 2015-11-28 DIAGNOSIS — N289 Disorder of kidney and ureter, unspecified: Secondary | ICD-10-CM | POA: Diagnosis not present

## 2016-05-30 DIAGNOSIS — K219 Gastro-esophageal reflux disease without esophagitis: Secondary | ICD-10-CM | POA: Diagnosis not present

## 2016-05-30 DIAGNOSIS — Z23 Encounter for immunization: Secondary | ICD-10-CM | POA: Diagnosis not present

## 2016-05-30 DIAGNOSIS — F329 Major depressive disorder, single episode, unspecified: Secondary | ICD-10-CM | POA: Diagnosis not present

## 2016-05-30 DIAGNOSIS — R350 Frequency of micturition: Secondary | ICD-10-CM | POA: Diagnosis not present

## 2016-05-30 DIAGNOSIS — L659 Nonscarring hair loss, unspecified: Secondary | ICD-10-CM | POA: Diagnosis not present

## 2016-05-30 DIAGNOSIS — I872 Venous insufficiency (chronic) (peripheral): Secondary | ICD-10-CM | POA: Diagnosis not present

## 2016-05-30 DIAGNOSIS — R2689 Other abnormalities of gait and mobility: Secondary | ICD-10-CM | POA: Diagnosis not present

## 2016-05-30 DIAGNOSIS — N289 Disorder of kidney and ureter, unspecified: Secondary | ICD-10-CM | POA: Diagnosis not present

## 2016-05-30 DIAGNOSIS — E871 Hypo-osmolality and hyponatremia: Secondary | ICD-10-CM | POA: Diagnosis not present

## 2016-05-30 DIAGNOSIS — E663 Overweight: Secondary | ICD-10-CM | POA: Diagnosis not present

## 2016-05-30 DIAGNOSIS — I10 Essential (primary) hypertension: Secondary | ICD-10-CM | POA: Diagnosis not present

## 2016-05-30 DIAGNOSIS — Z6827 Body mass index (BMI) 27.0-27.9, adult: Secondary | ICD-10-CM | POA: Diagnosis not present

## 2016-05-31 DIAGNOSIS — M810 Age-related osteoporosis without current pathological fracture: Secondary | ICD-10-CM | POA: Diagnosis not present

## 2016-05-31 DIAGNOSIS — Z6829 Body mass index (BMI) 29.0-29.9, adult: Secondary | ICD-10-CM | POA: Diagnosis not present

## 2016-05-31 DIAGNOSIS — F329 Major depressive disorder, single episode, unspecified: Secondary | ICD-10-CM | POA: Diagnosis not present

## 2016-05-31 DIAGNOSIS — I10 Essential (primary) hypertension: Secondary | ICD-10-CM | POA: Diagnosis not present

## 2016-05-31 DIAGNOSIS — M1991 Primary osteoarthritis, unspecified site: Secondary | ICD-10-CM | POA: Diagnosis not present

## 2016-05-31 DIAGNOSIS — E663 Overweight: Secondary | ICD-10-CM | POA: Diagnosis not present

## 2016-05-31 DIAGNOSIS — R2689 Other abnormalities of gait and mobility: Secondary | ICD-10-CM | POA: Diagnosis not present

## 2016-05-31 DIAGNOSIS — I872 Venous insufficiency (chronic) (peripheral): Secondary | ICD-10-CM | POA: Diagnosis not present

## 2016-05-31 DIAGNOSIS — G500 Trigeminal neuralgia: Secondary | ICD-10-CM | POA: Diagnosis not present

## 2016-05-31 DIAGNOSIS — R531 Weakness: Secondary | ICD-10-CM | POA: Diagnosis not present

## 2016-06-01 DIAGNOSIS — R531 Weakness: Secondary | ICD-10-CM | POA: Diagnosis not present

## 2016-06-01 DIAGNOSIS — R2689 Other abnormalities of gait and mobility: Secondary | ICD-10-CM | POA: Diagnosis not present

## 2016-06-04 DIAGNOSIS — R531 Weakness: Secondary | ICD-10-CM | POA: Diagnosis not present

## 2016-06-04 DIAGNOSIS — R2689 Other abnormalities of gait and mobility: Secondary | ICD-10-CM | POA: Diagnosis not present

## 2016-06-08 DIAGNOSIS — R2689 Other abnormalities of gait and mobility: Secondary | ICD-10-CM | POA: Diagnosis not present

## 2016-06-08 DIAGNOSIS — R531 Weakness: Secondary | ICD-10-CM | POA: Diagnosis not present

## 2016-06-11 DIAGNOSIS — R2689 Other abnormalities of gait and mobility: Secondary | ICD-10-CM | POA: Diagnosis not present

## 2016-06-11 DIAGNOSIS — R531 Weakness: Secondary | ICD-10-CM | POA: Diagnosis not present

## 2016-06-13 DIAGNOSIS — R2689 Other abnormalities of gait and mobility: Secondary | ICD-10-CM | POA: Diagnosis not present

## 2016-06-13 DIAGNOSIS — R531 Weakness: Secondary | ICD-10-CM | POA: Diagnosis not present

## 2016-06-18 DIAGNOSIS — R531 Weakness: Secondary | ICD-10-CM | POA: Diagnosis not present

## 2016-06-18 DIAGNOSIS — R2689 Other abnormalities of gait and mobility: Secondary | ICD-10-CM | POA: Diagnosis not present

## 2016-06-22 DIAGNOSIS — R2689 Other abnormalities of gait and mobility: Secondary | ICD-10-CM | POA: Diagnosis not present

## 2016-06-22 DIAGNOSIS — R531 Weakness: Secondary | ICD-10-CM | POA: Diagnosis not present

## 2016-06-26 DIAGNOSIS — R531 Weakness: Secondary | ICD-10-CM | POA: Diagnosis not present

## 2016-06-26 DIAGNOSIS — R2689 Other abnormalities of gait and mobility: Secondary | ICD-10-CM | POA: Diagnosis not present

## 2016-06-28 DIAGNOSIS — R531 Weakness: Secondary | ICD-10-CM | POA: Diagnosis not present

## 2016-06-28 DIAGNOSIS — R2689 Other abnormalities of gait and mobility: Secondary | ICD-10-CM | POA: Diagnosis not present

## 2016-07-03 DIAGNOSIS — R2689 Other abnormalities of gait and mobility: Secondary | ICD-10-CM | POA: Diagnosis not present

## 2016-07-03 DIAGNOSIS — R531 Weakness: Secondary | ICD-10-CM | POA: Diagnosis not present

## 2016-07-05 DIAGNOSIS — R2689 Other abnormalities of gait and mobility: Secondary | ICD-10-CM | POA: Diagnosis not present

## 2016-07-05 DIAGNOSIS — R531 Weakness: Secondary | ICD-10-CM | POA: Diagnosis not present

## 2016-07-09 DIAGNOSIS — R2689 Other abnormalities of gait and mobility: Secondary | ICD-10-CM | POA: Diagnosis not present

## 2016-07-09 DIAGNOSIS — R531 Weakness: Secondary | ICD-10-CM | POA: Diagnosis not present

## 2016-07-12 DIAGNOSIS — R2689 Other abnormalities of gait and mobility: Secondary | ICD-10-CM | POA: Diagnosis not present

## 2016-07-12 DIAGNOSIS — R531 Weakness: Secondary | ICD-10-CM | POA: Diagnosis not present

## 2016-07-16 DIAGNOSIS — R2689 Other abnormalities of gait and mobility: Secondary | ICD-10-CM | POA: Diagnosis not present

## 2016-07-16 DIAGNOSIS — R531 Weakness: Secondary | ICD-10-CM | POA: Diagnosis not present

## 2016-07-18 DIAGNOSIS — R2689 Other abnormalities of gait and mobility: Secondary | ICD-10-CM | POA: Diagnosis not present

## 2016-07-18 DIAGNOSIS — R531 Weakness: Secondary | ICD-10-CM | POA: Diagnosis not present

## 2016-12-05 DIAGNOSIS — I10 Essential (primary) hypertension: Secondary | ICD-10-CM | POA: Diagnosis not present

## 2016-12-05 DIAGNOSIS — M810 Age-related osteoporosis without current pathological fracture: Secondary | ICD-10-CM | POA: Diagnosis not present

## 2016-12-12 DIAGNOSIS — I872 Venous insufficiency (chronic) (peripheral): Secondary | ICD-10-CM | POA: Diagnosis not present

## 2016-12-12 DIAGNOSIS — I10 Essential (primary) hypertension: Secondary | ICD-10-CM | POA: Diagnosis not present

## 2016-12-12 DIAGNOSIS — F329 Major depressive disorder, single episode, unspecified: Secondary | ICD-10-CM | POA: Diagnosis not present

## 2016-12-12 DIAGNOSIS — I8310 Varicose veins of unspecified lower extremity with inflammation: Secondary | ICD-10-CM | POA: Diagnosis not present

## 2016-12-12 DIAGNOSIS — R609 Edema, unspecified: Secondary | ICD-10-CM | POA: Diagnosis not present

## 2016-12-12 DIAGNOSIS — Z0000 Encounter for general adult medical examination without abnormal findings: Secondary | ICD-10-CM | POA: Diagnosis not present

## 2016-12-12 DIAGNOSIS — Z1389 Encounter for screening for other disorder: Secondary | ICD-10-CM | POA: Diagnosis not present

## 2016-12-12 DIAGNOSIS — R350 Frequency of micturition: Secondary | ICD-10-CM | POA: Diagnosis not present

## 2016-12-12 DIAGNOSIS — E871 Hypo-osmolality and hyponatremia: Secondary | ICD-10-CM | POA: Diagnosis not present

## 2016-12-12 DIAGNOSIS — N289 Disorder of kidney and ureter, unspecified: Secondary | ICD-10-CM | POA: Diagnosis not present

## 2016-12-12 DIAGNOSIS — R2689 Other abnormalities of gait and mobility: Secondary | ICD-10-CM | POA: Diagnosis not present

## 2016-12-12 DIAGNOSIS — Z6826 Body mass index (BMI) 26.0-26.9, adult: Secondary | ICD-10-CM | POA: Diagnosis not present

## 2017-04-19 DIAGNOSIS — R42 Dizziness and giddiness: Secondary | ICD-10-CM | POA: Diagnosis not present

## 2017-04-19 DIAGNOSIS — Z6826 Body mass index (BMI) 26.0-26.9, adult: Secondary | ICD-10-CM | POA: Diagnosis not present

## 2017-04-19 DIAGNOSIS — R110 Nausea: Secondary | ICD-10-CM | POA: Diagnosis not present

## 2017-06-13 DIAGNOSIS — R609 Edema, unspecified: Secondary | ICD-10-CM | POA: Diagnosis not present

## 2017-06-13 DIAGNOSIS — F329 Major depressive disorder, single episode, unspecified: Secondary | ICD-10-CM | POA: Diagnosis not present

## 2017-06-13 DIAGNOSIS — E871 Hypo-osmolality and hyponatremia: Secondary | ICD-10-CM | POA: Diagnosis not present

## 2017-06-13 DIAGNOSIS — Z23 Encounter for immunization: Secondary | ICD-10-CM | POA: Diagnosis not present

## 2017-06-13 DIAGNOSIS — R42 Dizziness and giddiness: Secondary | ICD-10-CM | POA: Diagnosis not present

## 2017-06-13 DIAGNOSIS — I872 Venous insufficiency (chronic) (peripheral): Secondary | ICD-10-CM | POA: Diagnosis not present

## 2017-06-13 DIAGNOSIS — I10 Essential (primary) hypertension: Secondary | ICD-10-CM | POA: Diagnosis not present

## 2017-06-13 DIAGNOSIS — R2689 Other abnormalities of gait and mobility: Secondary | ICD-10-CM | POA: Diagnosis not present

## 2017-06-13 DIAGNOSIS — M810 Age-related osteoporosis without current pathological fracture: Secondary | ICD-10-CM | POA: Diagnosis not present

## 2017-06-13 DIAGNOSIS — I8310 Varicose veins of unspecified lower extremity with inflammation: Secondary | ICD-10-CM | POA: Diagnosis not present

## 2017-06-13 DIAGNOSIS — N289 Disorder of kidney and ureter, unspecified: Secondary | ICD-10-CM | POA: Diagnosis not present

## 2017-06-13 DIAGNOSIS — Z6828 Body mass index (BMI) 28.0-28.9, adult: Secondary | ICD-10-CM | POA: Diagnosis not present

## 2017-12-16 DIAGNOSIS — M810 Age-related osteoporosis without current pathological fracture: Secondary | ICD-10-CM | POA: Diagnosis not present

## 2017-12-16 DIAGNOSIS — I10 Essential (primary) hypertension: Secondary | ICD-10-CM | POA: Diagnosis not present

## 2017-12-25 DIAGNOSIS — I872 Venous insufficiency (chronic) (peripheral): Secondary | ICD-10-CM | POA: Diagnosis not present

## 2017-12-25 DIAGNOSIS — K219 Gastro-esophageal reflux disease without esophagitis: Secondary | ICD-10-CM | POA: Diagnosis not present

## 2017-12-25 DIAGNOSIS — G500 Trigeminal neuralgia: Secondary | ICD-10-CM | POA: Diagnosis not present

## 2017-12-25 DIAGNOSIS — R2689 Other abnormalities of gait and mobility: Secondary | ICD-10-CM | POA: Diagnosis not present

## 2017-12-25 DIAGNOSIS — R609 Edema, unspecified: Secondary | ICD-10-CM | POA: Diagnosis not present

## 2017-12-25 DIAGNOSIS — R82998 Other abnormal findings in urine: Secondary | ICD-10-CM | POA: Diagnosis not present

## 2017-12-25 DIAGNOSIS — Z6826 Body mass index (BMI) 26.0-26.9, adult: Secondary | ICD-10-CM | POA: Diagnosis not present

## 2017-12-25 DIAGNOSIS — Z1389 Encounter for screening for other disorder: Secondary | ICD-10-CM | POA: Diagnosis not present

## 2017-12-25 DIAGNOSIS — Z0000 Encounter for general adult medical examination without abnormal findings: Secondary | ICD-10-CM | POA: Diagnosis not present

## 2017-12-25 DIAGNOSIS — I10 Essential (primary) hypertension: Secondary | ICD-10-CM | POA: Diagnosis not present

## 2017-12-25 DIAGNOSIS — F329 Major depressive disorder, single episode, unspecified: Secondary | ICD-10-CM | POA: Diagnosis not present

## 2017-12-25 DIAGNOSIS — N289 Disorder of kidney and ureter, unspecified: Secondary | ICD-10-CM | POA: Diagnosis not present

## 2017-12-25 DIAGNOSIS — E871 Hypo-osmolality and hyponatremia: Secondary | ICD-10-CM | POA: Diagnosis not present

## 2018-06-23 DIAGNOSIS — I10 Essential (primary) hypertension: Secondary | ICD-10-CM | POA: Diagnosis not present

## 2018-06-23 DIAGNOSIS — I872 Venous insufficiency (chronic) (peripheral): Secondary | ICD-10-CM | POA: Diagnosis not present

## 2018-06-23 DIAGNOSIS — G500 Trigeminal neuralgia: Secondary | ICD-10-CM | POA: Diagnosis not present

## 2018-06-23 DIAGNOSIS — R609 Edema, unspecified: Secondary | ICD-10-CM | POA: Diagnosis not present

## 2018-06-23 DIAGNOSIS — Z23 Encounter for immunization: Secondary | ICD-10-CM | POA: Diagnosis not present

## 2018-06-23 DIAGNOSIS — R61 Generalized hyperhidrosis: Secondary | ICD-10-CM | POA: Diagnosis not present

## 2018-06-23 DIAGNOSIS — M1990 Unspecified osteoarthritis, unspecified site: Secondary | ICD-10-CM | POA: Diagnosis not present

## 2018-06-23 DIAGNOSIS — N289 Disorder of kidney and ureter, unspecified: Secondary | ICD-10-CM | POA: Diagnosis not present

## 2018-06-23 DIAGNOSIS — R109 Unspecified abdominal pain: Secondary | ICD-10-CM | POA: Diagnosis not present

## 2018-06-23 DIAGNOSIS — Z6827 Body mass index (BMI) 27.0-27.9, adult: Secondary | ICD-10-CM | POA: Diagnosis not present

## 2018-06-23 DIAGNOSIS — M810 Age-related osteoporosis without current pathological fracture: Secondary | ICD-10-CM | POA: Diagnosis not present

## 2018-06-23 DIAGNOSIS — R110 Nausea: Secondary | ICD-10-CM | POA: Diagnosis not present

## 2018-06-24 ENCOUNTER — Other Ambulatory Visit: Payer: Self-pay | Admitting: Internal Medicine

## 2018-06-25 ENCOUNTER — Other Ambulatory Visit: Payer: Self-pay | Admitting: Internal Medicine

## 2018-06-25 DIAGNOSIS — R61 Generalized hyperhidrosis: Secondary | ICD-10-CM

## 2018-06-25 DIAGNOSIS — R110 Nausea: Secondary | ICD-10-CM

## 2018-06-25 DIAGNOSIS — R109 Unspecified abdominal pain: Secondary | ICD-10-CM

## 2018-07-03 ENCOUNTER — Other Ambulatory Visit: Payer: PRIVATE HEALTH INSURANCE

## 2018-07-04 ENCOUNTER — Ambulatory Visit
Admission: RE | Admit: 2018-07-04 | Discharge: 2018-07-04 | Disposition: A | Discharge status: Home / Self Care | Payer: Medicare Other | Source: Ambulatory Visit | Attending: Internal Medicine | Admitting: Internal Medicine

## 2018-07-04 DIAGNOSIS — R61 Generalized hyperhidrosis: Secondary | ICD-10-CM

## 2018-07-04 DIAGNOSIS — R110 Nausea: Secondary | ICD-10-CM

## 2018-07-04 DIAGNOSIS — R109 Unspecified abdominal pain: Secondary | ICD-10-CM

## 2018-07-04 DIAGNOSIS — K5730 Diverticulosis of large intestine without perforation or abscess without bleeding: Secondary | ICD-10-CM | POA: Diagnosis not present

## 2018-07-04 MED ORDER — IOPAMIDOL (ISOVUE-300) INJECTION 61%
100.0000 mL | Freq: Once | INTRAVENOUS | Status: AC | PRN
  Administered 2018-07-04: 100 mL via INTRAVENOUS

## 2018-12-03 DIAGNOSIS — G500 Trigeminal neuralgia: Secondary | ICD-10-CM | POA: Diagnosis not present

## 2018-12-03 DIAGNOSIS — F329 Major depressive disorder, single episode, unspecified: Secondary | ICD-10-CM | POA: Diagnosis not present

## 2018-12-03 DIAGNOSIS — M810 Age-related osteoporosis without current pathological fracture: Secondary | ICD-10-CM | POA: Diagnosis not present

## 2018-12-03 DIAGNOSIS — I10 Essential (primary) hypertension: Secondary | ICD-10-CM | POA: Diagnosis not present

## 2018-12-03 DIAGNOSIS — I872 Venous insufficiency (chronic) (peripheral): Secondary | ICD-10-CM | POA: Diagnosis not present

## 2018-12-03 DIAGNOSIS — R2689 Other abnormalities of gait and mobility: Secondary | ICD-10-CM | POA: Diagnosis not present

## 2018-12-03 DIAGNOSIS — E871 Hypo-osmolality and hyponatremia: Secondary | ICD-10-CM | POA: Diagnosis not present

## 2018-12-03 DIAGNOSIS — M1990 Unspecified osteoarthritis, unspecified site: Secondary | ICD-10-CM | POA: Diagnosis not present

## 2018-12-03 DIAGNOSIS — K219 Gastro-esophageal reflux disease without esophagitis: Secondary | ICD-10-CM | POA: Diagnosis not present

## 2018-12-03 DIAGNOSIS — N289 Disorder of kidney and ureter, unspecified: Secondary | ICD-10-CM | POA: Diagnosis not present

## 2018-12-03 DIAGNOSIS — Z1331 Encounter for screening for depression: Secondary | ICD-10-CM | POA: Diagnosis not present

## 2018-12-03 DIAGNOSIS — R609 Edema, unspecified: Secondary | ICD-10-CM | POA: Diagnosis not present

## 2018-12-04 DIAGNOSIS — Z6826 Body mass index (BMI) 26.0-26.9, adult: Secondary | ICD-10-CM | POA: Diagnosis not present

## 2018-12-04 DIAGNOSIS — M810 Age-related osteoporosis without current pathological fracture: Secondary | ICD-10-CM | POA: Diagnosis not present

## 2018-12-04 DIAGNOSIS — E663 Overweight: Secondary | ICD-10-CM | POA: Diagnosis not present

## 2018-12-04 DIAGNOSIS — I129 Hypertensive chronic kidney disease with stage 1 through stage 4 chronic kidney disease, or unspecified chronic kidney disease: Secondary | ICD-10-CM | POA: Diagnosis not present

## 2018-12-04 DIAGNOSIS — N189 Chronic kidney disease, unspecified: Secondary | ICD-10-CM | POA: Diagnosis not present

## 2018-12-04 DIAGNOSIS — Z602 Problems related to living alone: Secondary | ICD-10-CM | POA: Diagnosis not present

## 2018-12-04 DIAGNOSIS — Z742 Need for assistance at home and no other household member able to render care: Secondary | ICD-10-CM | POA: Diagnosis not present

## 2018-12-04 DIAGNOSIS — G500 Trigeminal neuralgia: Secondary | ICD-10-CM | POA: Diagnosis not present

## 2018-12-04 DIAGNOSIS — K219 Gastro-esophageal reflux disease without esophagitis: Secondary | ICD-10-CM | POA: Diagnosis not present

## 2018-12-04 DIAGNOSIS — Z9181 History of falling: Secondary | ICD-10-CM | POA: Diagnosis not present

## 2018-12-04 DIAGNOSIS — F329 Major depressive disorder, single episode, unspecified: Secondary | ICD-10-CM | POA: Diagnosis not present

## 2018-12-04 DIAGNOSIS — I872 Venous insufficiency (chronic) (peripheral): Secondary | ICD-10-CM | POA: Diagnosis not present

## 2018-12-04 DIAGNOSIS — E871 Hypo-osmolality and hyponatremia: Secondary | ICD-10-CM | POA: Diagnosis not present

## 2018-12-04 DIAGNOSIS — M1990 Unspecified osteoarthritis, unspecified site: Secondary | ICD-10-CM | POA: Diagnosis not present

## 2018-12-08 DIAGNOSIS — M810 Age-related osteoporosis without current pathological fracture: Secondary | ICD-10-CM | POA: Diagnosis not present

## 2018-12-08 DIAGNOSIS — E871 Hypo-osmolality and hyponatremia: Secondary | ICD-10-CM | POA: Diagnosis not present

## 2018-12-08 DIAGNOSIS — M1990 Unspecified osteoarthritis, unspecified site: Secondary | ICD-10-CM | POA: Diagnosis not present

## 2018-12-08 DIAGNOSIS — G500 Trigeminal neuralgia: Secondary | ICD-10-CM | POA: Diagnosis not present

## 2018-12-08 DIAGNOSIS — I129 Hypertensive chronic kidney disease with stage 1 through stage 4 chronic kidney disease, or unspecified chronic kidney disease: Secondary | ICD-10-CM | POA: Diagnosis not present

## 2018-12-08 DIAGNOSIS — N189 Chronic kidney disease, unspecified: Secondary | ICD-10-CM | POA: Diagnosis not present

## 2018-12-09 DIAGNOSIS — I129 Hypertensive chronic kidney disease with stage 1 through stage 4 chronic kidney disease, or unspecified chronic kidney disease: Secondary | ICD-10-CM | POA: Diagnosis not present

## 2018-12-09 DIAGNOSIS — M810 Age-related osteoporosis without current pathological fracture: Secondary | ICD-10-CM | POA: Diagnosis not present

## 2018-12-09 DIAGNOSIS — G500 Trigeminal neuralgia: Secondary | ICD-10-CM | POA: Diagnosis not present

## 2018-12-09 DIAGNOSIS — N189 Chronic kidney disease, unspecified: Secondary | ICD-10-CM | POA: Diagnosis not present

## 2018-12-09 DIAGNOSIS — M1990 Unspecified osteoarthritis, unspecified site: Secondary | ICD-10-CM | POA: Diagnosis not present

## 2018-12-09 DIAGNOSIS — E871 Hypo-osmolality and hyponatremia: Secondary | ICD-10-CM | POA: Diagnosis not present

## 2018-12-10 DIAGNOSIS — R609 Edema, unspecified: Secondary | ICD-10-CM | POA: Diagnosis not present

## 2018-12-10 DIAGNOSIS — I10 Essential (primary) hypertension: Secondary | ICD-10-CM | POA: Diagnosis not present

## 2018-12-10 DIAGNOSIS — Z9189 Other specified personal risk factors, not elsewhere classified: Secondary | ICD-10-CM | POA: Diagnosis not present

## 2018-12-10 DIAGNOSIS — Z9181 History of falling: Secondary | ICD-10-CM | POA: Diagnosis not present

## 2018-12-10 DIAGNOSIS — N289 Disorder of kidney and ureter, unspecified: Secondary | ICD-10-CM | POA: Diagnosis not present

## 2018-12-10 DIAGNOSIS — R2689 Other abnormalities of gait and mobility: Secondary | ICD-10-CM | POA: Diagnosis not present

## 2018-12-10 DIAGNOSIS — I129 Hypertensive chronic kidney disease with stage 1 through stage 4 chronic kidney disease, or unspecified chronic kidney disease: Secondary | ICD-10-CM | POA: Diagnosis not present

## 2018-12-10 DIAGNOSIS — M810 Age-related osteoporosis without current pathological fracture: Secondary | ICD-10-CM | POA: Diagnosis not present

## 2018-12-10 DIAGNOSIS — M1990 Unspecified osteoarthritis, unspecified site: Secondary | ICD-10-CM | POA: Diagnosis not present

## 2018-12-10 DIAGNOSIS — E871 Hypo-osmolality and hyponatremia: Secondary | ICD-10-CM | POA: Diagnosis not present

## 2018-12-10 DIAGNOSIS — I872 Venous insufficiency (chronic) (peripheral): Secondary | ICD-10-CM | POA: Diagnosis not present

## 2018-12-10 DIAGNOSIS — K219 Gastro-esophageal reflux disease without esophagitis: Secondary | ICD-10-CM | POA: Diagnosis not present

## 2018-12-10 DIAGNOSIS — G500 Trigeminal neuralgia: Secondary | ICD-10-CM | POA: Diagnosis not present

## 2018-12-10 DIAGNOSIS — N189 Chronic kidney disease, unspecified: Secondary | ICD-10-CM | POA: Diagnosis not present

## 2018-12-12 DIAGNOSIS — M1990 Unspecified osteoarthritis, unspecified site: Secondary | ICD-10-CM | POA: Diagnosis not present

## 2018-12-12 DIAGNOSIS — M810 Age-related osteoporosis without current pathological fracture: Secondary | ICD-10-CM | POA: Diagnosis not present

## 2018-12-12 DIAGNOSIS — I129 Hypertensive chronic kidney disease with stage 1 through stage 4 chronic kidney disease, or unspecified chronic kidney disease: Secondary | ICD-10-CM | POA: Diagnosis not present

## 2018-12-12 DIAGNOSIS — E871 Hypo-osmolality and hyponatremia: Secondary | ICD-10-CM | POA: Diagnosis not present

## 2018-12-12 DIAGNOSIS — N189 Chronic kidney disease, unspecified: Secondary | ICD-10-CM | POA: Diagnosis not present

## 2018-12-12 DIAGNOSIS — G500 Trigeminal neuralgia: Secondary | ICD-10-CM | POA: Diagnosis not present

## 2018-12-15 DIAGNOSIS — M810 Age-related osteoporosis without current pathological fracture: Secondary | ICD-10-CM | POA: Diagnosis not present

## 2018-12-15 DIAGNOSIS — I129 Hypertensive chronic kidney disease with stage 1 through stage 4 chronic kidney disease, or unspecified chronic kidney disease: Secondary | ICD-10-CM | POA: Diagnosis not present

## 2018-12-15 DIAGNOSIS — M1990 Unspecified osteoarthritis, unspecified site: Secondary | ICD-10-CM | POA: Diagnosis not present

## 2018-12-15 DIAGNOSIS — N189 Chronic kidney disease, unspecified: Secondary | ICD-10-CM | POA: Diagnosis not present

## 2018-12-15 DIAGNOSIS — E871 Hypo-osmolality and hyponatremia: Secondary | ICD-10-CM | POA: Diagnosis not present

## 2018-12-15 DIAGNOSIS — G500 Trigeminal neuralgia: Secondary | ICD-10-CM | POA: Diagnosis not present

## 2018-12-16 DIAGNOSIS — E871 Hypo-osmolality and hyponatremia: Secondary | ICD-10-CM | POA: Diagnosis not present

## 2018-12-16 DIAGNOSIS — G500 Trigeminal neuralgia: Secondary | ICD-10-CM | POA: Diagnosis not present

## 2018-12-16 DIAGNOSIS — M1990 Unspecified osteoarthritis, unspecified site: Secondary | ICD-10-CM | POA: Diagnosis not present

## 2018-12-16 DIAGNOSIS — N189 Chronic kidney disease, unspecified: Secondary | ICD-10-CM | POA: Diagnosis not present

## 2018-12-16 DIAGNOSIS — I129 Hypertensive chronic kidney disease with stage 1 through stage 4 chronic kidney disease, or unspecified chronic kidney disease: Secondary | ICD-10-CM | POA: Diagnosis not present

## 2018-12-16 DIAGNOSIS — M810 Age-related osteoporosis without current pathological fracture: Secondary | ICD-10-CM | POA: Diagnosis not present

## 2018-12-17 DIAGNOSIS — M810 Age-related osteoporosis without current pathological fracture: Secondary | ICD-10-CM | POA: Diagnosis not present

## 2018-12-17 DIAGNOSIS — R2689 Other abnormalities of gait and mobility: Secondary | ICD-10-CM | POA: Diagnosis not present

## 2018-12-17 DIAGNOSIS — R609 Edema, unspecified: Secondary | ICD-10-CM | POA: Diagnosis not present

## 2018-12-17 DIAGNOSIS — K219 Gastro-esophageal reflux disease without esophagitis: Secondary | ICD-10-CM | POA: Diagnosis not present

## 2018-12-17 DIAGNOSIS — G500 Trigeminal neuralgia: Secondary | ICD-10-CM | POA: Diagnosis not present

## 2018-12-17 DIAGNOSIS — I10 Essential (primary) hypertension: Secondary | ICD-10-CM | POA: Diagnosis not present

## 2018-12-17 DIAGNOSIS — N289 Disorder of kidney and ureter, unspecified: Secondary | ICD-10-CM | POA: Diagnosis not present

## 2018-12-17 DIAGNOSIS — M1990 Unspecified osteoarthritis, unspecified site: Secondary | ICD-10-CM | POA: Diagnosis not present

## 2018-12-17 DIAGNOSIS — R61 Generalized hyperhidrosis: Secondary | ICD-10-CM | POA: Diagnosis not present

## 2018-12-17 DIAGNOSIS — E871 Hypo-osmolality and hyponatremia: Secondary | ICD-10-CM | POA: Diagnosis not present

## 2018-12-17 DIAGNOSIS — I872 Venous insufficiency (chronic) (peripheral): Secondary | ICD-10-CM | POA: Diagnosis not present

## 2018-12-17 DIAGNOSIS — Z9189 Other specified personal risk factors, not elsewhere classified: Secondary | ICD-10-CM | POA: Diagnosis not present

## 2018-12-19 DIAGNOSIS — M810 Age-related osteoporosis without current pathological fracture: Secondary | ICD-10-CM | POA: Diagnosis not present

## 2018-12-19 DIAGNOSIS — M1990 Unspecified osteoarthritis, unspecified site: Secondary | ICD-10-CM | POA: Diagnosis not present

## 2018-12-19 DIAGNOSIS — E871 Hypo-osmolality and hyponatremia: Secondary | ICD-10-CM | POA: Diagnosis not present

## 2018-12-19 DIAGNOSIS — I129 Hypertensive chronic kidney disease with stage 1 through stage 4 chronic kidney disease, or unspecified chronic kidney disease: Secondary | ICD-10-CM | POA: Diagnosis not present

## 2018-12-19 DIAGNOSIS — G500 Trigeminal neuralgia: Secondary | ICD-10-CM | POA: Diagnosis not present

## 2018-12-19 DIAGNOSIS — N189 Chronic kidney disease, unspecified: Secondary | ICD-10-CM | POA: Diagnosis not present

## 2018-12-22 DIAGNOSIS — N189 Chronic kidney disease, unspecified: Secondary | ICD-10-CM | POA: Diagnosis not present

## 2018-12-22 DIAGNOSIS — M1990 Unspecified osteoarthritis, unspecified site: Secondary | ICD-10-CM | POA: Diagnosis not present

## 2018-12-22 DIAGNOSIS — M810 Age-related osteoporosis without current pathological fracture: Secondary | ICD-10-CM | POA: Diagnosis not present

## 2018-12-22 DIAGNOSIS — I129 Hypertensive chronic kidney disease with stage 1 through stage 4 chronic kidney disease, or unspecified chronic kidney disease: Secondary | ICD-10-CM | POA: Diagnosis not present

## 2018-12-22 DIAGNOSIS — G500 Trigeminal neuralgia: Secondary | ICD-10-CM | POA: Diagnosis not present

## 2018-12-22 DIAGNOSIS — E871 Hypo-osmolality and hyponatremia: Secondary | ICD-10-CM | POA: Diagnosis not present

## 2018-12-23 DIAGNOSIS — M1990 Unspecified osteoarthritis, unspecified site: Secondary | ICD-10-CM | POA: Diagnosis not present

## 2018-12-23 DIAGNOSIS — I129 Hypertensive chronic kidney disease with stage 1 through stage 4 chronic kidney disease, or unspecified chronic kidney disease: Secondary | ICD-10-CM | POA: Diagnosis not present

## 2018-12-23 DIAGNOSIS — G500 Trigeminal neuralgia: Secondary | ICD-10-CM | POA: Diagnosis not present

## 2018-12-23 DIAGNOSIS — E871 Hypo-osmolality and hyponatremia: Secondary | ICD-10-CM | POA: Diagnosis not present

## 2018-12-23 DIAGNOSIS — M810 Age-related osteoporosis without current pathological fracture: Secondary | ICD-10-CM | POA: Diagnosis not present

## 2018-12-23 DIAGNOSIS — N189 Chronic kidney disease, unspecified: Secondary | ICD-10-CM | POA: Diagnosis not present

## 2018-12-24 DIAGNOSIS — R609 Edema, unspecified: Secondary | ICD-10-CM | POA: Diagnosis not present

## 2018-12-24 DIAGNOSIS — N189 Chronic kidney disease, unspecified: Secondary | ICD-10-CM | POA: Diagnosis not present

## 2018-12-24 DIAGNOSIS — F39 Unspecified mood [affective] disorder: Secondary | ICD-10-CM | POA: Diagnosis not present

## 2018-12-24 DIAGNOSIS — E871 Hypo-osmolality and hyponatremia: Secondary | ICD-10-CM | POA: Diagnosis not present

## 2018-12-24 DIAGNOSIS — N289 Disorder of kidney and ureter, unspecified: Secondary | ICD-10-CM | POA: Diagnosis not present

## 2018-12-24 DIAGNOSIS — E669 Obesity, unspecified: Secondary | ICD-10-CM | POA: Diagnosis not present

## 2018-12-24 DIAGNOSIS — M810 Age-related osteoporosis without current pathological fracture: Secondary | ICD-10-CM | POA: Diagnosis not present

## 2018-12-24 DIAGNOSIS — R2689 Other abnormalities of gait and mobility: Secondary | ICD-10-CM | POA: Diagnosis not present

## 2018-12-24 DIAGNOSIS — M1990 Unspecified osteoarthritis, unspecified site: Secondary | ICD-10-CM | POA: Diagnosis not present

## 2018-12-24 DIAGNOSIS — I129 Hypertensive chronic kidney disease with stage 1 through stage 4 chronic kidney disease, or unspecified chronic kidney disease: Secondary | ICD-10-CM | POA: Diagnosis not present

## 2018-12-24 DIAGNOSIS — I10 Essential (primary) hypertension: Secondary | ICD-10-CM | POA: Diagnosis not present

## 2018-12-24 DIAGNOSIS — K219 Gastro-esophageal reflux disease without esophagitis: Secondary | ICD-10-CM | POA: Diagnosis not present

## 2018-12-24 DIAGNOSIS — I872 Venous insufficiency (chronic) (peripheral): Secondary | ICD-10-CM | POA: Diagnosis not present

## 2018-12-24 DIAGNOSIS — G500 Trigeminal neuralgia: Secondary | ICD-10-CM | POA: Diagnosis not present

## 2018-12-30 DIAGNOSIS — M810 Age-related osteoporosis without current pathological fracture: Secondary | ICD-10-CM | POA: Diagnosis not present

## 2018-12-30 DIAGNOSIS — I129 Hypertensive chronic kidney disease with stage 1 through stage 4 chronic kidney disease, or unspecified chronic kidney disease: Secondary | ICD-10-CM | POA: Diagnosis not present

## 2018-12-30 DIAGNOSIS — N189 Chronic kidney disease, unspecified: Secondary | ICD-10-CM | POA: Diagnosis not present

## 2018-12-30 DIAGNOSIS — M1990 Unspecified osteoarthritis, unspecified site: Secondary | ICD-10-CM | POA: Diagnosis not present

## 2018-12-30 DIAGNOSIS — E871 Hypo-osmolality and hyponatremia: Secondary | ICD-10-CM | POA: Diagnosis not present

## 2018-12-30 DIAGNOSIS — G500 Trigeminal neuralgia: Secondary | ICD-10-CM | POA: Diagnosis not present

## 2018-12-31 DIAGNOSIS — M1990 Unspecified osteoarthritis, unspecified site: Secondary | ICD-10-CM | POA: Diagnosis not present

## 2018-12-31 DIAGNOSIS — I129 Hypertensive chronic kidney disease with stage 1 through stage 4 chronic kidney disease, or unspecified chronic kidney disease: Secondary | ICD-10-CM | POA: Diagnosis not present

## 2018-12-31 DIAGNOSIS — M810 Age-related osteoporosis without current pathological fracture: Secondary | ICD-10-CM | POA: Diagnosis not present

## 2018-12-31 DIAGNOSIS — E871 Hypo-osmolality and hyponatremia: Secondary | ICD-10-CM | POA: Diagnosis not present

## 2018-12-31 DIAGNOSIS — N189 Chronic kidney disease, unspecified: Secondary | ICD-10-CM | POA: Diagnosis not present

## 2018-12-31 DIAGNOSIS — G500 Trigeminal neuralgia: Secondary | ICD-10-CM | POA: Diagnosis not present

## 2019-01-03 DIAGNOSIS — Z742 Need for assistance at home and no other household member able to render care: Secondary | ICD-10-CM | POA: Diagnosis not present

## 2019-01-03 DIAGNOSIS — E871 Hypo-osmolality and hyponatremia: Secondary | ICD-10-CM | POA: Diagnosis not present

## 2019-01-03 DIAGNOSIS — Z602 Problems related to living alone: Secondary | ICD-10-CM | POA: Diagnosis not present

## 2019-01-03 DIAGNOSIS — I872 Venous insufficiency (chronic) (peripheral): Secondary | ICD-10-CM | POA: Diagnosis not present

## 2019-01-03 DIAGNOSIS — F329 Major depressive disorder, single episode, unspecified: Secondary | ICD-10-CM | POA: Diagnosis not present

## 2019-01-03 DIAGNOSIS — I129 Hypertensive chronic kidney disease with stage 1 through stage 4 chronic kidney disease, or unspecified chronic kidney disease: Secondary | ICD-10-CM | POA: Diagnosis not present

## 2019-01-03 DIAGNOSIS — Z6826 Body mass index (BMI) 26.0-26.9, adult: Secondary | ICD-10-CM | POA: Diagnosis not present

## 2019-01-03 DIAGNOSIS — K219 Gastro-esophageal reflux disease without esophagitis: Secondary | ICD-10-CM | POA: Diagnosis not present

## 2019-01-03 DIAGNOSIS — Z9181 History of falling: Secondary | ICD-10-CM | POA: Diagnosis not present

## 2019-01-03 DIAGNOSIS — E663 Overweight: Secondary | ICD-10-CM | POA: Diagnosis not present

## 2019-01-03 DIAGNOSIS — M810 Age-related osteoporosis without current pathological fracture: Secondary | ICD-10-CM | POA: Diagnosis not present

## 2019-01-03 DIAGNOSIS — N189 Chronic kidney disease, unspecified: Secondary | ICD-10-CM | POA: Diagnosis not present

## 2019-01-03 DIAGNOSIS — M1990 Unspecified osteoarthritis, unspecified site: Secondary | ICD-10-CM | POA: Diagnosis not present

## 2019-01-03 DIAGNOSIS — G500 Trigeminal neuralgia: Secondary | ICD-10-CM | POA: Diagnosis not present

## 2019-01-05 DIAGNOSIS — E871 Hypo-osmolality and hyponatremia: Secondary | ICD-10-CM | POA: Diagnosis not present

## 2019-01-05 DIAGNOSIS — G500 Trigeminal neuralgia: Secondary | ICD-10-CM | POA: Diagnosis not present

## 2019-01-05 DIAGNOSIS — M810 Age-related osteoporosis without current pathological fracture: Secondary | ICD-10-CM | POA: Diagnosis not present

## 2019-01-05 DIAGNOSIS — M1990 Unspecified osteoarthritis, unspecified site: Secondary | ICD-10-CM | POA: Diagnosis not present

## 2019-01-05 DIAGNOSIS — N189 Chronic kidney disease, unspecified: Secondary | ICD-10-CM | POA: Diagnosis not present

## 2019-01-05 DIAGNOSIS — I129 Hypertensive chronic kidney disease with stage 1 through stage 4 chronic kidney disease, or unspecified chronic kidney disease: Secondary | ICD-10-CM | POA: Diagnosis not present

## 2019-01-07 DIAGNOSIS — M810 Age-related osteoporosis without current pathological fracture: Secondary | ICD-10-CM | POA: Diagnosis not present

## 2019-01-07 DIAGNOSIS — M1990 Unspecified osteoarthritis, unspecified site: Secondary | ICD-10-CM | POA: Diagnosis not present

## 2019-01-07 DIAGNOSIS — R609 Edema, unspecified: Secondary | ICD-10-CM | POA: Diagnosis not present

## 2019-01-07 DIAGNOSIS — I10 Essential (primary) hypertension: Secondary | ICD-10-CM | POA: Diagnosis not present

## 2019-01-07 DIAGNOSIS — I872 Venous insufficiency (chronic) (peripheral): Secondary | ICD-10-CM | POA: Diagnosis not present

## 2019-01-07 DIAGNOSIS — F329 Major depressive disorder, single episode, unspecified: Secondary | ICD-10-CM | POA: Diagnosis not present

## 2019-01-07 DIAGNOSIS — G500 Trigeminal neuralgia: Secondary | ICD-10-CM | POA: Diagnosis not present

## 2019-01-07 DIAGNOSIS — K219 Gastro-esophageal reflux disease without esophagitis: Secondary | ICD-10-CM | POA: Diagnosis not present

## 2019-01-07 DIAGNOSIS — R2689 Other abnormalities of gait and mobility: Secondary | ICD-10-CM | POA: Diagnosis not present

## 2019-01-07 DIAGNOSIS — E871 Hypo-osmolality and hyponatremia: Secondary | ICD-10-CM | POA: Diagnosis not present

## 2019-01-07 DIAGNOSIS — N289 Disorder of kidney and ureter, unspecified: Secondary | ICD-10-CM | POA: Diagnosis not present

## 2019-01-07 DIAGNOSIS — Z9189 Other specified personal risk factors, not elsewhere classified: Secondary | ICD-10-CM | POA: Diagnosis not present

## 2019-01-08 DIAGNOSIS — M810 Age-related osteoporosis without current pathological fracture: Secondary | ICD-10-CM | POA: Diagnosis not present

## 2019-01-08 DIAGNOSIS — N189 Chronic kidney disease, unspecified: Secondary | ICD-10-CM | POA: Diagnosis not present

## 2019-01-08 DIAGNOSIS — G500 Trigeminal neuralgia: Secondary | ICD-10-CM | POA: Diagnosis not present

## 2019-01-08 DIAGNOSIS — M1990 Unspecified osteoarthritis, unspecified site: Secondary | ICD-10-CM | POA: Diagnosis not present

## 2019-01-08 DIAGNOSIS — I129 Hypertensive chronic kidney disease with stage 1 through stage 4 chronic kidney disease, or unspecified chronic kidney disease: Secondary | ICD-10-CM | POA: Diagnosis not present

## 2019-01-08 DIAGNOSIS — E871 Hypo-osmolality and hyponatremia: Secondary | ICD-10-CM | POA: Diagnosis not present

## 2019-01-12 DIAGNOSIS — E871 Hypo-osmolality and hyponatremia: Secondary | ICD-10-CM | POA: Diagnosis not present

## 2019-01-12 DIAGNOSIS — I129 Hypertensive chronic kidney disease with stage 1 through stage 4 chronic kidney disease, or unspecified chronic kidney disease: Secondary | ICD-10-CM | POA: Diagnosis not present

## 2019-01-12 DIAGNOSIS — M810 Age-related osteoporosis without current pathological fracture: Secondary | ICD-10-CM | POA: Diagnosis not present

## 2019-01-12 DIAGNOSIS — N189 Chronic kidney disease, unspecified: Secondary | ICD-10-CM | POA: Diagnosis not present

## 2019-01-12 DIAGNOSIS — M1990 Unspecified osteoarthritis, unspecified site: Secondary | ICD-10-CM | POA: Diagnosis not present

## 2019-01-12 DIAGNOSIS — G500 Trigeminal neuralgia: Secondary | ICD-10-CM | POA: Diagnosis not present

## 2019-01-14 DIAGNOSIS — N189 Chronic kidney disease, unspecified: Secondary | ICD-10-CM | POA: Diagnosis not present

## 2019-01-14 DIAGNOSIS — I129 Hypertensive chronic kidney disease with stage 1 through stage 4 chronic kidney disease, or unspecified chronic kidney disease: Secondary | ICD-10-CM | POA: Diagnosis not present

## 2019-01-14 DIAGNOSIS — M1990 Unspecified osteoarthritis, unspecified site: Secondary | ICD-10-CM | POA: Diagnosis not present

## 2019-01-14 DIAGNOSIS — E871 Hypo-osmolality and hyponatremia: Secondary | ICD-10-CM | POA: Diagnosis not present

## 2019-01-14 DIAGNOSIS — M810 Age-related osteoporosis without current pathological fracture: Secondary | ICD-10-CM | POA: Diagnosis not present

## 2019-01-14 DIAGNOSIS — G500 Trigeminal neuralgia: Secondary | ICD-10-CM | POA: Diagnosis not present

## 2019-01-21 DIAGNOSIS — G500 Trigeminal neuralgia: Secondary | ICD-10-CM | POA: Diagnosis not present

## 2019-01-21 DIAGNOSIS — N189 Chronic kidney disease, unspecified: Secondary | ICD-10-CM | POA: Diagnosis not present

## 2019-01-21 DIAGNOSIS — I129 Hypertensive chronic kidney disease with stage 1 through stage 4 chronic kidney disease, or unspecified chronic kidney disease: Secondary | ICD-10-CM | POA: Diagnosis not present

## 2019-01-21 DIAGNOSIS — M810 Age-related osteoporosis without current pathological fracture: Secondary | ICD-10-CM | POA: Diagnosis not present

## 2019-01-21 DIAGNOSIS — M1990 Unspecified osteoarthritis, unspecified site: Secondary | ICD-10-CM | POA: Diagnosis not present

## 2019-01-21 DIAGNOSIS — E871 Hypo-osmolality and hyponatremia: Secondary | ICD-10-CM | POA: Diagnosis not present

## 2019-01-28 DIAGNOSIS — G500 Trigeminal neuralgia: Secondary | ICD-10-CM | POA: Diagnosis not present

## 2019-01-28 DIAGNOSIS — M1990 Unspecified osteoarthritis, unspecified site: Secondary | ICD-10-CM | POA: Diagnosis not present

## 2019-01-28 DIAGNOSIS — E871 Hypo-osmolality and hyponatremia: Secondary | ICD-10-CM | POA: Diagnosis not present

## 2019-01-28 DIAGNOSIS — M810 Age-related osteoporosis without current pathological fracture: Secondary | ICD-10-CM | POA: Diagnosis not present

## 2019-01-28 DIAGNOSIS — I129 Hypertensive chronic kidney disease with stage 1 through stage 4 chronic kidney disease, or unspecified chronic kidney disease: Secondary | ICD-10-CM | POA: Diagnosis not present

## 2019-01-28 DIAGNOSIS — N189 Chronic kidney disease, unspecified: Secondary | ICD-10-CM | POA: Diagnosis not present

## 2019-02-02 DIAGNOSIS — I872 Venous insufficiency (chronic) (peripheral): Secondary | ICD-10-CM | POA: Diagnosis not present

## 2019-02-02 DIAGNOSIS — Z9849 Cataract extraction status, unspecified eye: Secondary | ICD-10-CM | POA: Diagnosis not present

## 2019-02-02 DIAGNOSIS — Z9049 Acquired absence of other specified parts of digestive tract: Secondary | ICD-10-CM | POA: Diagnosis not present

## 2019-02-02 DIAGNOSIS — E663 Overweight: Secondary | ICD-10-CM | POA: Diagnosis not present

## 2019-02-02 DIAGNOSIS — R609 Edema, unspecified: Secondary | ICD-10-CM | POA: Diagnosis not present

## 2019-02-02 DIAGNOSIS — G500 Trigeminal neuralgia: Secondary | ICD-10-CM | POA: Diagnosis not present

## 2019-02-02 DIAGNOSIS — M810 Age-related osteoporosis without current pathological fracture: Secondary | ICD-10-CM | POA: Diagnosis not present

## 2019-02-02 DIAGNOSIS — E871 Hypo-osmolality and hyponatremia: Secondary | ICD-10-CM | POA: Diagnosis not present

## 2019-02-02 DIAGNOSIS — Z6826 Body mass index (BMI) 26.0-26.9, adult: Secondary | ICD-10-CM | POA: Diagnosis not present

## 2019-02-02 DIAGNOSIS — N189 Chronic kidney disease, unspecified: Secondary | ICD-10-CM | POA: Diagnosis not present

## 2019-02-02 DIAGNOSIS — I129 Hypertensive chronic kidney disease with stage 1 through stage 4 chronic kidney disease, or unspecified chronic kidney disease: Secondary | ICD-10-CM | POA: Diagnosis not present

## 2019-02-02 DIAGNOSIS — Z9181 History of falling: Secondary | ICD-10-CM | POA: Diagnosis not present

## 2019-02-02 DIAGNOSIS — K219 Gastro-esophageal reflux disease without esophagitis: Secondary | ICD-10-CM | POA: Diagnosis not present

## 2019-02-02 DIAGNOSIS — M1990 Unspecified osteoarthritis, unspecified site: Secondary | ICD-10-CM | POA: Diagnosis not present

## 2019-02-02 DIAGNOSIS — F329 Major depressive disorder, single episode, unspecified: Secondary | ICD-10-CM | POA: Diagnosis not present

## 2019-02-03 DIAGNOSIS — I129 Hypertensive chronic kidney disease with stage 1 through stage 4 chronic kidney disease, or unspecified chronic kidney disease: Secondary | ICD-10-CM | POA: Diagnosis not present

## 2019-02-03 DIAGNOSIS — M810 Age-related osteoporosis without current pathological fracture: Secondary | ICD-10-CM | POA: Diagnosis not present

## 2019-02-03 DIAGNOSIS — M1990 Unspecified osteoarthritis, unspecified site: Secondary | ICD-10-CM | POA: Diagnosis not present

## 2019-02-03 DIAGNOSIS — N189 Chronic kidney disease, unspecified: Secondary | ICD-10-CM | POA: Diagnosis not present

## 2019-02-03 DIAGNOSIS — G500 Trigeminal neuralgia: Secondary | ICD-10-CM | POA: Diagnosis not present

## 2019-02-03 DIAGNOSIS — E871 Hypo-osmolality and hyponatremia: Secondary | ICD-10-CM | POA: Diagnosis not present

## 2019-02-04 DIAGNOSIS — E871 Hypo-osmolality and hyponatremia: Secondary | ICD-10-CM | POA: Diagnosis not present

## 2019-02-04 DIAGNOSIS — R2689 Other abnormalities of gait and mobility: Secondary | ICD-10-CM | POA: Diagnosis not present

## 2019-02-04 DIAGNOSIS — R609 Edema, unspecified: Secondary | ICD-10-CM | POA: Diagnosis not present

## 2019-02-04 DIAGNOSIS — Z9189 Other specified personal risk factors, not elsewhere classified: Secondary | ICD-10-CM | POA: Diagnosis not present

## 2019-02-04 DIAGNOSIS — N289 Disorder of kidney and ureter, unspecified: Secondary | ICD-10-CM | POA: Diagnosis not present

## 2019-02-04 DIAGNOSIS — G500 Trigeminal neuralgia: Secondary | ICD-10-CM | POA: Diagnosis not present

## 2019-02-04 DIAGNOSIS — I872 Venous insufficiency (chronic) (peripheral): Secondary | ICD-10-CM | POA: Diagnosis not present

## 2019-02-04 DIAGNOSIS — M810 Age-related osteoporosis without current pathological fracture: Secondary | ICD-10-CM | POA: Diagnosis not present

## 2019-02-04 DIAGNOSIS — Z9181 History of falling: Secondary | ICD-10-CM | POA: Diagnosis not present

## 2019-02-04 DIAGNOSIS — M1990 Unspecified osteoarthritis, unspecified site: Secondary | ICD-10-CM | POA: Diagnosis not present

## 2019-02-04 DIAGNOSIS — I10 Essential (primary) hypertension: Secondary | ICD-10-CM | POA: Diagnosis not present

## 2019-02-04 DIAGNOSIS — K219 Gastro-esophageal reflux disease without esophagitis: Secondary | ICD-10-CM | POA: Diagnosis not present

## 2019-02-06 DIAGNOSIS — M1990 Unspecified osteoarthritis, unspecified site: Secondary | ICD-10-CM | POA: Diagnosis not present

## 2019-02-06 DIAGNOSIS — E871 Hypo-osmolality and hyponatremia: Secondary | ICD-10-CM | POA: Diagnosis not present

## 2019-02-06 DIAGNOSIS — I129 Hypertensive chronic kidney disease with stage 1 through stage 4 chronic kidney disease, or unspecified chronic kidney disease: Secondary | ICD-10-CM | POA: Diagnosis not present

## 2019-02-06 DIAGNOSIS — M810 Age-related osteoporosis without current pathological fracture: Secondary | ICD-10-CM | POA: Diagnosis not present

## 2019-02-06 DIAGNOSIS — G500 Trigeminal neuralgia: Secondary | ICD-10-CM | POA: Diagnosis not present

## 2019-02-06 DIAGNOSIS — N189 Chronic kidney disease, unspecified: Secondary | ICD-10-CM | POA: Diagnosis not present

## 2019-02-09 DIAGNOSIS — I129 Hypertensive chronic kidney disease with stage 1 through stage 4 chronic kidney disease, or unspecified chronic kidney disease: Secondary | ICD-10-CM | POA: Diagnosis not present

## 2019-02-09 DIAGNOSIS — N189 Chronic kidney disease, unspecified: Secondary | ICD-10-CM | POA: Diagnosis not present

## 2019-02-09 DIAGNOSIS — G500 Trigeminal neuralgia: Secondary | ICD-10-CM | POA: Diagnosis not present

## 2019-02-09 DIAGNOSIS — E871 Hypo-osmolality and hyponatremia: Secondary | ICD-10-CM | POA: Diagnosis not present

## 2019-02-09 DIAGNOSIS — M1990 Unspecified osteoarthritis, unspecified site: Secondary | ICD-10-CM | POA: Diagnosis not present

## 2019-02-09 DIAGNOSIS — M810 Age-related osteoporosis without current pathological fracture: Secondary | ICD-10-CM | POA: Diagnosis not present

## 2019-02-17 DIAGNOSIS — I129 Hypertensive chronic kidney disease with stage 1 through stage 4 chronic kidney disease, or unspecified chronic kidney disease: Secondary | ICD-10-CM | POA: Diagnosis not present

## 2019-02-17 DIAGNOSIS — M810 Age-related osteoporosis without current pathological fracture: Secondary | ICD-10-CM | POA: Diagnosis not present

## 2019-02-17 DIAGNOSIS — E871 Hypo-osmolality and hyponatremia: Secondary | ICD-10-CM | POA: Diagnosis not present

## 2019-02-17 DIAGNOSIS — N189 Chronic kidney disease, unspecified: Secondary | ICD-10-CM | POA: Diagnosis not present

## 2019-02-17 DIAGNOSIS — G500 Trigeminal neuralgia: Secondary | ICD-10-CM | POA: Diagnosis not present

## 2019-02-17 DIAGNOSIS — M1990 Unspecified osteoarthritis, unspecified site: Secondary | ICD-10-CM | POA: Diagnosis not present

## 2019-02-25 DIAGNOSIS — I129 Hypertensive chronic kidney disease with stage 1 through stage 4 chronic kidney disease, or unspecified chronic kidney disease: Secondary | ICD-10-CM | POA: Diagnosis not present

## 2019-02-25 DIAGNOSIS — N189 Chronic kidney disease, unspecified: Secondary | ICD-10-CM | POA: Diagnosis not present

## 2019-02-25 DIAGNOSIS — M1990 Unspecified osteoarthritis, unspecified site: Secondary | ICD-10-CM | POA: Diagnosis not present

## 2019-02-25 DIAGNOSIS — E871 Hypo-osmolality and hyponatremia: Secondary | ICD-10-CM | POA: Diagnosis not present

## 2019-02-25 DIAGNOSIS — M810 Age-related osteoporosis without current pathological fracture: Secondary | ICD-10-CM | POA: Diagnosis not present

## 2019-02-25 DIAGNOSIS — G500 Trigeminal neuralgia: Secondary | ICD-10-CM | POA: Diagnosis not present

## 2019-03-04 DIAGNOSIS — F329 Major depressive disorder, single episode, unspecified: Secondary | ICD-10-CM | POA: Diagnosis not present

## 2019-03-04 DIAGNOSIS — I129 Hypertensive chronic kidney disease with stage 1 through stage 4 chronic kidney disease, or unspecified chronic kidney disease: Secondary | ICD-10-CM | POA: Diagnosis not present

## 2019-03-04 DIAGNOSIS — M1990 Unspecified osteoarthritis, unspecified site: Secondary | ICD-10-CM | POA: Diagnosis not present

## 2019-03-04 DIAGNOSIS — K219 Gastro-esophageal reflux disease without esophagitis: Secondary | ICD-10-CM | POA: Diagnosis not present

## 2019-03-04 DIAGNOSIS — R609 Edema, unspecified: Secondary | ICD-10-CM | POA: Diagnosis not present

## 2019-03-04 DIAGNOSIS — I872 Venous insufficiency (chronic) (peripheral): Secondary | ICD-10-CM | POA: Diagnosis not present

## 2019-03-04 DIAGNOSIS — M810 Age-related osteoporosis without current pathological fracture: Secondary | ICD-10-CM | POA: Diagnosis not present

## 2019-03-04 DIAGNOSIS — N189 Chronic kidney disease, unspecified: Secondary | ICD-10-CM | POA: Diagnosis not present

## 2019-03-04 DIAGNOSIS — E871 Hypo-osmolality and hyponatremia: Secondary | ICD-10-CM | POA: Diagnosis not present

## 2019-03-04 DIAGNOSIS — G500 Trigeminal neuralgia: Secondary | ICD-10-CM | POA: Diagnosis not present

## 2019-03-04 DIAGNOSIS — Z9181 History of falling: Secondary | ICD-10-CM | POA: Diagnosis not present

## 2019-03-04 DIAGNOSIS — E663 Overweight: Secondary | ICD-10-CM | POA: Diagnosis not present

## 2019-03-04 DIAGNOSIS — Z9849 Cataract extraction status, unspecified eye: Secondary | ICD-10-CM | POA: Diagnosis not present

## 2019-03-04 DIAGNOSIS — Z9049 Acquired absence of other specified parts of digestive tract: Secondary | ICD-10-CM | POA: Diagnosis not present

## 2019-03-04 DIAGNOSIS — Z6826 Body mass index (BMI) 26.0-26.9, adult: Secondary | ICD-10-CM | POA: Diagnosis not present

## 2019-03-09 DIAGNOSIS — R2689 Other abnormalities of gait and mobility: Secondary | ICD-10-CM | POA: Diagnosis not present

## 2019-03-09 DIAGNOSIS — M1990 Unspecified osteoarthritis, unspecified site: Secondary | ICD-10-CM | POA: Diagnosis not present

## 2019-03-09 DIAGNOSIS — F329 Major depressive disorder, single episode, unspecified: Secondary | ICD-10-CM | POA: Diagnosis not present

## 2019-03-09 DIAGNOSIS — G500 Trigeminal neuralgia: Secondary | ICD-10-CM | POA: Diagnosis not present

## 2019-03-09 DIAGNOSIS — R609 Edema, unspecified: Secondary | ICD-10-CM | POA: Diagnosis not present

## 2019-03-09 DIAGNOSIS — I872 Venous insufficiency (chronic) (peripheral): Secondary | ICD-10-CM | POA: Diagnosis not present

## 2019-03-09 DIAGNOSIS — Z9181 History of falling: Secondary | ICD-10-CM | POA: Diagnosis not present

## 2019-03-09 DIAGNOSIS — N289 Disorder of kidney and ureter, unspecified: Secondary | ICD-10-CM | POA: Diagnosis not present

## 2019-03-09 DIAGNOSIS — E871 Hypo-osmolality and hyponatremia: Secondary | ICD-10-CM | POA: Diagnosis not present

## 2019-03-09 DIAGNOSIS — M810 Age-related osteoporosis without current pathological fracture: Secondary | ICD-10-CM | POA: Diagnosis not present

## 2019-03-09 DIAGNOSIS — I10 Essential (primary) hypertension: Secondary | ICD-10-CM | POA: Diagnosis not present

## 2019-03-09 DIAGNOSIS — K219 Gastro-esophageal reflux disease without esophagitis: Secondary | ICD-10-CM | POA: Diagnosis not present

## 2019-03-12 DIAGNOSIS — M810 Age-related osteoporosis without current pathological fracture: Secondary | ICD-10-CM | POA: Diagnosis not present

## 2019-03-12 DIAGNOSIS — E871 Hypo-osmolality and hyponatremia: Secondary | ICD-10-CM | POA: Diagnosis not present

## 2019-03-12 DIAGNOSIS — M1990 Unspecified osteoarthritis, unspecified site: Secondary | ICD-10-CM | POA: Diagnosis not present

## 2019-03-12 DIAGNOSIS — N189 Chronic kidney disease, unspecified: Secondary | ICD-10-CM | POA: Diagnosis not present

## 2019-03-12 DIAGNOSIS — G500 Trigeminal neuralgia: Secondary | ICD-10-CM | POA: Diagnosis not present

## 2019-03-12 DIAGNOSIS — I129 Hypertensive chronic kidney disease with stage 1 through stage 4 chronic kidney disease, or unspecified chronic kidney disease: Secondary | ICD-10-CM | POA: Diagnosis not present

## 2019-03-16 DIAGNOSIS — M810 Age-related osteoporosis without current pathological fracture: Secondary | ICD-10-CM | POA: Diagnosis not present

## 2019-03-16 DIAGNOSIS — E871 Hypo-osmolality and hyponatremia: Secondary | ICD-10-CM | POA: Diagnosis not present

## 2019-03-16 DIAGNOSIS — G500 Trigeminal neuralgia: Secondary | ICD-10-CM | POA: Diagnosis not present

## 2019-03-16 DIAGNOSIS — M1990 Unspecified osteoarthritis, unspecified site: Secondary | ICD-10-CM | POA: Diagnosis not present

## 2019-03-16 DIAGNOSIS — N189 Chronic kidney disease, unspecified: Secondary | ICD-10-CM | POA: Diagnosis not present

## 2019-03-16 DIAGNOSIS — I129 Hypertensive chronic kidney disease with stage 1 through stage 4 chronic kidney disease, or unspecified chronic kidney disease: Secondary | ICD-10-CM | POA: Diagnosis not present

## 2019-04-09 DIAGNOSIS — M810 Age-related osteoporosis without current pathological fracture: Secondary | ICD-10-CM | POA: Diagnosis not present

## 2019-04-09 DIAGNOSIS — G500 Trigeminal neuralgia: Secondary | ICD-10-CM | POA: Diagnosis not present

## 2019-04-09 DIAGNOSIS — F329 Major depressive disorder, single episode, unspecified: Secondary | ICD-10-CM | POA: Diagnosis not present

## 2019-04-09 DIAGNOSIS — R609 Edema, unspecified: Secondary | ICD-10-CM | POA: Diagnosis not present

## 2019-04-09 DIAGNOSIS — E871 Hypo-osmolality and hyponatremia: Secondary | ICD-10-CM | POA: Diagnosis not present

## 2019-04-09 DIAGNOSIS — N289 Disorder of kidney and ureter, unspecified: Secondary | ICD-10-CM | POA: Diagnosis not present

## 2019-04-09 DIAGNOSIS — I872 Venous insufficiency (chronic) (peripheral): Secondary | ICD-10-CM | POA: Diagnosis not present

## 2019-04-09 DIAGNOSIS — M1990 Unspecified osteoarthritis, unspecified site: Secondary | ICD-10-CM | POA: Diagnosis not present

## 2019-04-09 DIAGNOSIS — K219 Gastro-esophageal reflux disease without esophagitis: Secondary | ICD-10-CM | POA: Diagnosis not present

## 2019-04-09 DIAGNOSIS — I10 Essential (primary) hypertension: Secondary | ICD-10-CM | POA: Diagnosis not present

## 2019-04-09 DIAGNOSIS — R2689 Other abnormalities of gait and mobility: Secondary | ICD-10-CM | POA: Diagnosis not present

## 2019-04-09 DIAGNOSIS — Z9189 Other specified personal risk factors, not elsewhere classified: Secondary | ICD-10-CM | POA: Diagnosis not present

## 2019-07-13 DIAGNOSIS — I10 Essential (primary) hypertension: Secondary | ICD-10-CM | POA: Diagnosis not present

## 2019-07-13 DIAGNOSIS — G500 Trigeminal neuralgia: Secondary | ICD-10-CM | POA: Diagnosis not present

## 2019-07-13 DIAGNOSIS — Z9181 History of falling: Secondary | ICD-10-CM | POA: Diagnosis not present

## 2019-07-13 DIAGNOSIS — K219 Gastro-esophageal reflux disease without esophagitis: Secondary | ICD-10-CM | POA: Diagnosis not present

## 2019-07-13 DIAGNOSIS — R609 Edema, unspecified: Secondary | ICD-10-CM | POA: Diagnosis not present

## 2019-07-13 DIAGNOSIS — M1990 Unspecified osteoarthritis, unspecified site: Secondary | ICD-10-CM | POA: Diagnosis not present

## 2019-07-13 DIAGNOSIS — E871 Hypo-osmolality and hyponatremia: Secondary | ICD-10-CM | POA: Diagnosis not present

## 2019-07-13 DIAGNOSIS — N289 Disorder of kidney and ureter, unspecified: Secondary | ICD-10-CM | POA: Diagnosis not present

## 2019-07-13 DIAGNOSIS — R2689 Other abnormalities of gait and mobility: Secondary | ICD-10-CM | POA: Diagnosis not present

## 2019-07-13 DIAGNOSIS — I872 Venous insufficiency (chronic) (peripheral): Secondary | ICD-10-CM | POA: Diagnosis not present

## 2019-07-13 DIAGNOSIS — M810 Age-related osteoporosis without current pathological fracture: Secondary | ICD-10-CM | POA: Diagnosis not present

## 2019-07-13 DIAGNOSIS — Z9189 Other specified personal risk factors, not elsewhere classified: Secondary | ICD-10-CM | POA: Diagnosis not present

## 2019-09-30 DIAGNOSIS — Z9181 History of falling: Secondary | ICD-10-CM | POA: Diagnosis not present

## 2019-09-30 DIAGNOSIS — Z1331 Encounter for screening for depression: Secondary | ICD-10-CM | POA: Diagnosis not present

## 2019-09-30 DIAGNOSIS — F329 Major depressive disorder, single episode, unspecified: Secondary | ICD-10-CM | POA: Diagnosis not present

## 2019-09-30 DIAGNOSIS — K219 Gastro-esophageal reflux disease without esophagitis: Secondary | ICD-10-CM | POA: Diagnosis not present

## 2019-09-30 DIAGNOSIS — I10 Essential (primary) hypertension: Secondary | ICD-10-CM | POA: Diagnosis not present

## 2019-09-30 DIAGNOSIS — M810 Age-related osteoporosis without current pathological fracture: Secondary | ICD-10-CM | POA: Diagnosis not present

## 2019-09-30 DIAGNOSIS — I872 Venous insufficiency (chronic) (peripheral): Secondary | ICD-10-CM | POA: Diagnosis not present

## 2019-09-30 DIAGNOSIS — E871 Hypo-osmolality and hyponatremia: Secondary | ICD-10-CM | POA: Diagnosis not present

## 2019-09-30 DIAGNOSIS — G500 Trigeminal neuralgia: Secondary | ICD-10-CM | POA: Diagnosis not present

## 2019-10-23 ENCOUNTER — Ambulatory Visit: Payer: Medicare Other | Attending: Internal Medicine

## 2019-10-23 DIAGNOSIS — Z23 Encounter for immunization: Secondary | ICD-10-CM | POA: Insufficient documentation

## 2019-10-23 NOTE — Progress Notes (Signed)
   Covid-19 Vaccination Clinic  Name:  Kimberly English    MRN: [id number] DOB: 12-25-1924  10/23/2019  Kimberly English was observed post Covid-19 immunization for 15 minutes without incidence. She was provided with Vaccine Information Sheet and instruction to access the V-Safe system.   Kimberly English was instructed to call 911 with any severe reactions post vaccine: Marland Kitchen Difficulty breathing  . Swelling of your face and throat  . A fast heartbeat  . A bad rash all over your body  . Dizziness and weakness    Immunizations Administered    Name Date Dose VIS Date Route   Pfizer COVID-19 Vaccine 10/23/2019  8:34 AM 0.3 mL 08/07/2019 Intramuscular   Manufacturer: [place]   Lot: [id number]   [place]: [id number]

## 2019-11-18 ENCOUNTER — Ambulatory Visit: Payer: Medicare Other | Attending: Internal Medicine

## 2019-11-18 DIAGNOSIS — Z23 Encounter for immunization: Secondary | ICD-10-CM

## 2019-11-18 NOTE — Progress Notes (Signed)
   Covid-19 Vaccination Clinic  Name:  FEVEN PETTERSON    MRN: [id number] DOB: 1924/11/15  11/18/2019  Ms. Bazurto was observed post Covid-19 immunization for 15 minutes without incident. She was provided with Vaccine Information Sheet and instruction to access the V-Safe system.   Ms. Buddenhagen was instructed to call 911 with any severe reactions post vaccine: Marland Kitchen Difficulty breathing  . Swelling of face and throat  . A fast heartbeat  . A bad rash all over body  . Dizziness and weakness   Immunizations Administered    Name Date Dose VIS Date Route   Pfizer COVID-19 Vaccine 11/18/2019  8:40 AM 0.3 mL 08/07/2019 Intramuscular   Manufacturer: [place]   Lot: [id number]   [place]: [id number]

## 2020-01-29 DIAGNOSIS — M810 Age-related osteoporosis without current pathological fracture: Secondary | ICD-10-CM | POA: Diagnosis not present

## 2020-01-29 DIAGNOSIS — I10 Essential (primary) hypertension: Secondary | ICD-10-CM | POA: Diagnosis not present

## 2020-02-01 DIAGNOSIS — R82998 Other abnormal findings in urine: Secondary | ICD-10-CM | POA: Diagnosis not present

## 2020-02-01 DIAGNOSIS — I10 Essential (primary) hypertension: Secondary | ICD-10-CM | POA: Diagnosis not present

## 2020-02-03 DIAGNOSIS — R609 Edema, unspecified: Secondary | ICD-10-CM | POA: Diagnosis not present

## 2020-02-03 DIAGNOSIS — I10 Essential (primary) hypertension: Secondary | ICD-10-CM | POA: Diagnosis not present

## 2020-02-03 DIAGNOSIS — K219 Gastro-esophageal reflux disease without esophagitis: Secondary | ICD-10-CM | POA: Diagnosis not present

## 2020-02-03 DIAGNOSIS — I872 Venous insufficiency (chronic) (peripheral): Secondary | ICD-10-CM | POA: Diagnosis not present

## 2020-02-03 DIAGNOSIS — F329 Major depressive disorder, single episode, unspecified: Secondary | ICD-10-CM | POA: Diagnosis not present

## 2020-02-03 DIAGNOSIS — G500 Trigeminal neuralgia: Secondary | ICD-10-CM | POA: Diagnosis not present

## 2020-02-03 DIAGNOSIS — Z9189 Other specified personal risk factors, not elsewhere classified: Secondary | ICD-10-CM | POA: Diagnosis not present

## 2020-02-03 DIAGNOSIS — E663 Overweight: Secondary | ICD-10-CM | POA: Diagnosis not present

## 2020-02-03 DIAGNOSIS — M1990 Unspecified osteoarthritis, unspecified site: Secondary | ICD-10-CM | POA: Diagnosis not present

## 2020-02-03 DIAGNOSIS — Z0000 Encounter for general adult medical examination without abnormal findings: Secondary | ICD-10-CM | POA: Diagnosis not present

## 2020-02-03 DIAGNOSIS — R2689 Other abnormalities of gait and mobility: Secondary | ICD-10-CM | POA: Diagnosis not present

## 2020-02-03 DIAGNOSIS — M810 Age-related osteoporosis without current pathological fracture: Secondary | ICD-10-CM | POA: Diagnosis not present

## 2020-05-30 DIAGNOSIS — G500 Trigeminal neuralgia: Secondary | ICD-10-CM | POA: Diagnosis not present

## 2020-05-30 DIAGNOSIS — E663 Overweight: Secondary | ICD-10-CM | POA: Diagnosis not present

## 2020-05-30 DIAGNOSIS — Z9181 History of falling: Secondary | ICD-10-CM | POA: Diagnosis not present

## 2020-05-30 DIAGNOSIS — I10 Essential (primary) hypertension: Secondary | ICD-10-CM | POA: Diagnosis not present

## 2020-06-27 DIAGNOSIS — M1990 Unspecified osteoarthritis, unspecified site: Secondary | ICD-10-CM | POA: Diagnosis not present

## 2020-06-27 DIAGNOSIS — Z9181 History of falling: Secondary | ICD-10-CM | POA: Diagnosis not present

## 2020-06-27 DIAGNOSIS — I10 Essential (primary) hypertension: Secondary | ICD-10-CM | POA: Diagnosis not present

## 2020-06-27 DIAGNOSIS — R2689 Other abnormalities of gait and mobility: Secondary | ICD-10-CM | POA: Diagnosis not present

## 2020-07-09 ENCOUNTER — Encounter (HOSPITAL_COMMUNITY): Payer: Self-pay

## 2020-07-09 ENCOUNTER — Emergency Department (HOSPITAL_COMMUNITY): Payer: Medicare Other

## 2020-07-09 ENCOUNTER — Inpatient Hospital Stay (HOSPITAL_COMMUNITY)
Admission: EM | Admit: 2020-07-09 | Discharge: 2020-07-11 | DRG: 536 | Disposition: A | Discharge status: Skilled Nursing Facility | Payer: Medicare Other | Attending: Internal Medicine | Admitting: Internal Medicine

## 2020-07-09 ENCOUNTER — Other Ambulatory Visit: Payer: Self-pay

## 2020-07-09 DIAGNOSIS — G4700 Insomnia, unspecified: Secondary | ICD-10-CM | POA: Diagnosis not present

## 2020-07-09 DIAGNOSIS — I10 Essential (primary) hypertension: Secondary | ICD-10-CM | POA: Diagnosis present

## 2020-07-09 DIAGNOSIS — Z20822 Contact with and (suspected) exposure to covid-19: Secondary | ICD-10-CM | POA: Diagnosis not present

## 2020-07-09 DIAGNOSIS — S32511A Fracture of superior rim of right pubis, initial encounter for closed fracture: Secondary | ICD-10-CM | POA: Diagnosis not present

## 2020-07-09 DIAGNOSIS — W010XXA Fall on same level from slipping, tripping and stumbling without subsequent striking against object, initial encounter: Secondary | ICD-10-CM | POA: Diagnosis present

## 2020-07-09 DIAGNOSIS — D696 Thrombocytopenia, unspecified: Secondary | ICD-10-CM | POA: Diagnosis present

## 2020-07-09 DIAGNOSIS — S32810A Multiple fractures of pelvis with stable disruption of pelvic ring, initial encounter for closed fracture: Principal | ICD-10-CM | POA: Diagnosis present

## 2020-07-09 DIAGNOSIS — Y92009 Unspecified place in unspecified non-institutional (private) residence as the place of occurrence of the external cause: Secondary | ICD-10-CM

## 2020-07-09 DIAGNOSIS — Z66 Do not resuscitate: Secondary | ICD-10-CM | POA: Diagnosis present

## 2020-07-09 DIAGNOSIS — M1711 Unilateral primary osteoarthritis, right knee: Secondary | ICD-10-CM | POA: Diagnosis not present

## 2020-07-09 DIAGNOSIS — F419 Anxiety disorder, unspecified: Secondary | ICD-10-CM | POA: Diagnosis not present

## 2020-07-09 DIAGNOSIS — S329XXA Fracture of unspecified parts of lumbosacral spine and pelvis, initial encounter for closed fracture: Secondary | ICD-10-CM | POA: Diagnosis not present

## 2020-07-09 DIAGNOSIS — I959 Hypotension, unspecified: Secondary | ICD-10-CM | POA: Diagnosis not present

## 2020-07-09 DIAGNOSIS — W19XXXA Unspecified fall, initial encounter: Secondary | ICD-10-CM

## 2020-07-09 DIAGNOSIS — R52 Pain, unspecified: Secondary | ICD-10-CM | POA: Diagnosis not present

## 2020-07-09 DIAGNOSIS — Z79899 Other long term (current) drug therapy: Secondary | ICD-10-CM

## 2020-07-09 DIAGNOSIS — Z885 Allergy status to narcotic agent status: Secondary | ICD-10-CM

## 2020-07-09 DIAGNOSIS — S32591A Other specified fracture of right pubis, initial encounter for closed fracture: Secondary | ICD-10-CM | POA: Diagnosis not present

## 2020-07-09 DIAGNOSIS — M1611 Unilateral primary osteoarthritis, right hip: Secondary | ICD-10-CM | POA: Diagnosis not present

## 2020-07-09 LAB — CBC WITH DIFFERENTIAL/PLATELET
Abs Immature Granulocytes: 0.04 10*3/uL (ref 0.00–0.07)
Basophils Absolute: 0 10*3/uL (ref 0.0–0.1)
Basophils Relative: 0 %
Eosinophils Absolute: 0 10*3/uL (ref 0.0–0.5)
Eosinophils Relative: 0 %
HCT: 40.9 % (ref 36.0–46.0)
Hemoglobin: 13.8 g/dL (ref 12.0–15.0)
Immature Granulocytes: 1 %
Lymphocytes Relative: 13 %
Lymphs Abs: 1 10*3/uL (ref 0.7–4.0)
MCH: 31.8 pg (ref 26.0–34.0)
MCHC: 33.7 g/dL (ref 30.0–36.0)
MCV: 94.2 fL (ref 80.0–100.0)
Monocytes Absolute: 0.7 10*3/uL (ref 0.1–1.0)
Monocytes Relative: 10 %
Neutro Abs: 5.6 10*3/uL (ref 1.7–7.7)
Neutrophils Relative %: 76 %
Platelets: 123 10*3/uL — ABNORMAL LOW (ref 150–400)
RBC: 4.34 MIL/uL (ref 3.87–5.11)
RDW: 12.8 % (ref 11.5–15.5)
WBC: 7.4 10*3/uL (ref 4.0–10.5)
nRBC: 0 % (ref 0.0–0.2)

## 2020-07-09 LAB — BASIC METABOLIC PANEL
Anion gap: 11 (ref 5–15)
BUN: 15 mg/dL (ref 8–23)
CO2: 24 mmol/L (ref 22–32)
Calcium: 9.1 mg/dL (ref 8.9–10.3)
Chloride: 106 mmol/L (ref 98–111)
Creatinine, Ser: 0.63 mg/dL (ref 0.44–1.00)
GFR, Estimated: >60 mL/min (ref >60)
Glucose, Bld: 109 mg/dL — ABNORMAL HIGH (ref 70–99)
Potassium: 4.2 mmol/L (ref 3.5–5.1)
Sodium: 141 mmol/L (ref 135–145)

## 2020-07-09 LAB — RESPIRATORY PANEL BY RT PCR (FLU A&B, COVID)
Influenza A by PCR: NEGATIVE
Influenza B by PCR: NEGATIVE
SARS Coronavirus 2 by RT PCR: NEGATIVE

## 2020-07-09 MED ORDER — DOCUSATE SODIUM 100 MG PO CAPS
100.0000 mg | ORAL_CAPSULE | Freq: Two times a day (BID) | ORAL | Status: DC
  Administered 2020-07-09 – 2020-07-11 (×4): 100 mg via ORAL
  Filled 2020-07-09 (×4): qty 1

## 2020-07-09 MED ORDER — MIRTAZAPINE 15 MG PO TABS
15.0000 mg | ORAL_TABLET | Freq: Every day | ORAL | Status: DC
  Administered 2020-07-09 – 2020-07-10 (×2): 15 mg via ORAL
  Filled 2020-07-09 (×2): qty 1

## 2020-07-09 MED ORDER — HYDROCODONE-ACETAMINOPHEN 5-325 MG PO TABS
1.0000 | ORAL_TABLET | Freq: Four times a day (QID) | ORAL | Status: DC | PRN
  Administered 2020-07-09 – 2020-07-10 (×2): 1 via ORAL
  Filled 2020-07-09 (×2): qty 1

## 2020-07-09 MED ORDER — METOPROLOL SUCCINATE ER 25 MG PO TB24
25.0000 mg | ORAL_TABLET | Freq: Every day | ORAL | Status: DC
  Administered 2020-07-09 – 2020-07-11 (×3): 25 mg via ORAL
  Filled 2020-07-09 (×2): qty 1

## 2020-07-09 MED ORDER — HYDROMORPHONE HCL 1 MG/ML IJ SOLN: 0.5000 mg | INTRAMUSCULAR | Status: DC | PRN

## 2020-07-09 MED ORDER — ONDANSETRON HCL 4 MG/2ML IJ SOLN: 4.0000 mg | Freq: Four times a day (QID) | INTRAMUSCULAR | Status: DC | PRN

## 2020-07-09 MED ORDER — LORAZEPAM 0.5 MG PO TABS
0.5000 mg | ORAL_TABLET | Freq: Two times a day (BID) | ORAL | Status: DC | PRN
  Administered 2020-07-10: 01:00:00 0.5 mg via ORAL
  Filled 2020-07-09: qty 1

## 2020-07-09 MED ORDER — GABAPENTIN 100 MG PO CAPS
100.0000 mg | ORAL_CAPSULE | Freq: Three times a day (TID) | ORAL | Status: DC
  Administered 2020-07-09 – 2020-07-11 (×6): 100 mg via ORAL
  Filled 2020-07-09 (×6): qty 1

## 2020-07-09 MED ORDER — ENOXAPARIN SODIUM 40 MG/0.4ML ~~LOC~~ SOLN
40.0000 mg | SUBCUTANEOUS | Status: DC
  Administered 2020-07-09 – 2020-07-10 (×2): 40 mg via SUBCUTANEOUS
  Filled 2020-07-09 (×2): qty 0.4

## 2020-07-09 MED ORDER — FENTANYL CITRATE (PF) 100 MCG/2ML IJ SOLN
50.0000 ug | Freq: Once | INTRAMUSCULAR | Status: AC
  Administered 2020-07-09: 50 ug via INTRAVENOUS
  Filled 2020-07-09: qty 2

## 2020-07-09 MED ORDER — ADULT MULTIVITAMIN W/MINERALS CH
1.0000 | ORAL_TABLET | Freq: Every day | ORAL | Status: DC
  Administered 2020-07-10 – 2020-07-11 (×2): 1 via ORAL
  Filled 2020-07-09 (×2): qty 1

## 2020-07-09 MED ORDER — POLYETHYLENE GLYCOL 3350 17 G PO PACK: 17.0000 g | PACK | Freq: Every day | ORAL | Status: DC | PRN

## 2020-07-09 NOTE — ED Triage Notes (Signed)
Pt arrives via EMS. Pt had unwitnessed fall earlier, she reports she felt a pain when she stood up and then fell. NO LOC, no blood thinners. No shortening, rotation, or obvious deformity. Pt reports pain in right femur upon ambulation. 20 G LFA.    EMS vitals:  BP 152/50 HR 80 CBG 125 SPO2 98 % RA  RR 16

## 2020-07-09 NOTE — Progress Notes (Signed)
Ortho Note  Case reviewed with Dr. Melina Copa. 86 yo w/ R hip pain. CT scan of hip shows parasymphseal pubic ramus fracture. Patient can be WBAT. No surgical intervention. Recommend admission to hospitalist for pain control and mobilization with PT. Ortho to consult tomorrow.  Shona Needles, MD [facility] [phone] (office) [email or web address]

## 2020-07-09 NOTE — H&P (Addendum)
[facility] History and Physical  MICHALLE RADEMAKER WCH:[id number] DOB: [birth year] DOA: 07/09/2020  Referring physician: ED  PCP: Burnard Bunting, MD   Patient is coming from: Home  Chief Complaint: Fall  HPI: Kimberly English is a 86 y.o. female with past medical history of hypertension presented to the hospital brought in by EMS for unwitnessed fall at home. Patient stated that she fell when she turned quickly without any preceding dizziness, chest pain shortness of breath.  Patient stated that the feet got entangled.  There was no mention of loss of consciousness or head trauma. Patient continued to have pain in the right femur region upon ambulation so he was brought into the hospital. Pain has been sharp in intensity, worse with movement of the affected extremity. Patient denied any focal weakness or prior history of falls. At baseline, patient lives by herself at home.  She does have history of 1 fall in the past and uses a walker for ambulation at home.  ED Course: In the ED, patient complained of femur pain. X-ray of the hip did not show any evidence of fracture but osteoarthritis. X-ray of the right knee with osteoarthritis. CT scan of the right hip showed minimally displaced oblique fracture through the right superior pubic ramus extending to the pubic symphysis. Orthopedics was consulted from the ED who recommended inpatient pain control and mobilization with PT. Patient complained of severe pain in the ED with difficulty weightbearing and pain and received IV fentanyl in the emergency department. COVID-19 test was negative. CBC showed mild thrombocytopenia. BMP was notable for glucose of 109.  Review of Systems:  All systems were reviewed and were negative unless otherwise mentioned in the HPI  Past Medical History:  Diagnosis Date  . Hypertension    Past Surgical History:  Procedure Laterality Date  . EYE SURGERY      Social History:  reports that she has never  smoked. She does not have any smokeless tobacco history on file. She reports that she does not drink alcohol and does not use drugs.  Allergies  Allergen Reactions  . Codeine Nausea And Vomiting    History reviewed. No pertinent family history.   Prior to Admission medications   Medication Sig Start Date End Date Taking? Authorizing Provider  cycloSPORINE (RESTASIS) 0.05 % ophthalmic emulsion Place 1 drop into both eyes 2 (two) times daily.    [provider]  mirtazapine (REMERON) 30 MG tablet Take 30 mg by mouth at bedtime.    [provider]  Multiple Vitamin (MULTIVITAMIN WITH MINERALS) TABS Take 1 tablet by mouth daily.    [provider]  omeprazole (PRILOSEC) 20 MG capsule Take 20 mg by mouth daily.    [provider]  zolpidem (AMBIEN) 10 MG tablet Take 10 mg by mouth at bedtime as needed. For sleep    [provider]    Physical Exam: Vitals:   07/09/20 1745 07/09/20 1800 07/09/20 1815 07/09/20 1830  BP: (!) 123/50 121/60  (!) 147/74  Pulse: 73 74 86 77  Resp:    18  Temp:      TempSrc:      SpO2: 99% 98% 98% 95%  Weight:      Height:       Wt Readings from Last 3 Encounters:  07/09/20 68 kg  06/17/12 69.5 kg   Body mass index is 27.44 kg/m.  General:  Average built, not in obvious distress, hard of hearing HENT: Normocephalic, pupils equally reacting  to light and accommodation.  No scleral pallor or icterus noted. Oral mucosa is moist.  Chest:  Clear breath sounds.  Diminished breath sounds bilaterally. No crackles or wheezes.  CVS: S1 &S2 heard. No murmur.  Regular rate and rhythm. Abdomen: Soft, nontender, nondistended.  Bowel sounds are heard.  Liver is not palpable, no abdominal mass palpated Extremities: No cyanosis, clubbing but bilateral lower extremity edema noted on stockings.  Peripheral pulses are palpable.  Very tenderness noted over the right hip and right knee region. Range of movement intact with  pain Psych: Alert, awake and oriented, normal mood CNS:  No cranial nerve deficits.  Power equal in all extremities.   No cerebellar signs.   Skin: Warm and dry.  No rashes noted.  Labs on Admission:   CBC: Recent Labs  Lab 07/09/20 1846  WBC 7.4  NEUTROABS 5.6  HGB 13.8  HCT 40.9  MCV 94.2  PLT 123*    Basic Metabolic Panel: Recent Labs  Lab 07/09/20 1846  NA 141  K 4.2  CL 106  CO2 24  GLUCOSE 109*  BUN 15  CREATININE 0.63  CALCIUM 9.1    Liver Function Tests: No results for input(s): AST, ALT, ALKPHOS, BILITOT, PROT, ALBUMIN in the last 168 hours. No results for input(s): LIPASE, AMYLASE in the last 168 hours. No results for input(s): AMMONIA in the last 168 hours.  Cardiac Enzymes: No results for input(s): CKTOTAL, CKMB, CKMBINDEX, TROPONINI in the last 168 hours.  BNP (last 3 results) No results for input(s): BNP in the last 8760 hours.  ProBNP (last 3 results) No results for input(s): PROBNP in the last 8760 hours.  CBG: No results for input(s): GLUCAP in the last 168 hours.  Lipase  No results found for: LIPASE   Urinalysis No results found for: COLORURINE, APPEARANCEUR, LABSPEC, PHURINE, GLUCOSEU, HGBUR, BILIRUBINUR, KETONESUR, PROTEINUR, UROBILINOGEN, NITRITE, LEUKOCYTESUR   Drugs of Abuse  No results found for: LABOPIA, [place], LABBENZ, AMPHETMU, THCU, LABBARB    Radiological Exams on Admission: CT Hip Right Wo Contrast  Result Date: 07/09/2020 CLINICAL DATA:  Hip trauma, pain EXAM: CT OF THE RIGHT HIP WITHOUT CONTRAST TECHNIQUE: Multidetector CT imaging of the right hip was performed according to the standard protocol. Multiplanar CT image reconstructions were also generated. COMPARISON:  07/09/2020 FINDINGS: Bones/Joint/Cartilage There is a minimally displaced oblique fracture through the right superior pubic ramus extending to the pubic symphysis. There are no other acute displaced fractures. Specifically, visualized portions of the  right femur are unremarkable. There is moderate right hip osteoarthritis.  No joint effusion. Ligaments Suboptimally assessed by CT. Muscles and Tendons Unremarkable. Soft tissues Mild soft tissue swelling adjacent to the right superior pubic ramus consistent with fracture described above. Remaining soft tissues are unremarkable. No free fluid within the pelvis. Diverticulosis of the sigmoid colon without diverticulitis. IMPRESSION: 1. Minimally displaced oblique fracture through the right superior pubic ramus, extending to the pubic symphysis. 2. Moderate right hip osteoarthritis. Electronically Signed   By: Randa Ngo M.D.   On: 07/09/2020 17:25   DG Knee Complete 4 Views Right  Result Date: 07/09/2020 CLINICAL DATA:  [place], pain EXAM: RIGHT KNEE - COMPLETE 4+ VIEW COMPARISON:  None. FINDINGS: Frontal, bilateral oblique, lateral views of the right knee are obtained. No fracture, subluxation, or dislocation. There is moderate 3 compartmental osteoarthritis, with joint space narrowing chondrocalcinosis seen within the medial and lateral compartments. No joint effusion. IMPRESSION: 1. Three compartmental osteoarthritis.  No acute fracture. Electronically Signed  By: Randa Ngo M.D.   On: 07/09/2020 16:10   DG Hip Unilat With Pelvis 2-3 Views Right  Result Date: 07/09/2020 CLINICAL DATA:  [place], pain EXAM: DG HIP (WITH OR WITHOUT PELVIS) 2-3V RIGHT COMPARISON:  None. FINDINGS: Frontal view of the pelvis as well as frontal and cross-table lateral views of the right hip are obtained. No acute displaced fracture. Symmetrical bilateral hip osteoarthritis. Sacroiliac joints are unremarkable. The soft tissues are normal. IMPRESSION: 1. Osteoarthritis.  No acute displaced fracture. Electronically Signed   By: Randa Ngo M.D.   On: 07/09/2020 16:14    EKG: Not available for review.  Assessment/Plan Active Problems:   Pelvic fracture (HCC)   Mechanical fall leading to right superior ramus  fracture with severe pain and ambulatory dysfunction.  Orthopedics has been notified from the ED.  Orthopedic to follow-up in a.m.  Will get PT evaluation for assessment of fall and weightbearing..  Focus on analgesia.  Add IV and oral analgesics.  Patient lives by herself at home.  Unsafe disposition at this time.  History of hypertension.  On metoprolol succinate at home.  We will continue with that.  History of anxiety.  Patient takes Ativan at home.  We will continue for now.  Patient does not have history of recurrent falls in the past.  History of insomnia.  Takes mirtazapine at home.  Will avoid Ambien for now.  Mild thrombocytopenia.  Platelet of 123 on presentation.  No previous history of thrombocytopenia.  Will need to closely monitor.  No evidence of bleeding.  DVT Prophylaxis: Lovenox subcu  Consultant: Orthopedics Dr. Doreatha Martin  Code Status: DNR.  Spoke with the patient as well as patient's son about it.  Microbiology none  Antibiotics: None  Family Communication:  Patients' condition and plan of care including tests being ordered have been discussed with the patient and the patient's son Mr. Alyla Pietila on the phone who indicate understanding and agree with the plan.  Status is: Observation  The patient remains OBS appropriate and will d/c before 2 midnights.  Dispo: The patient is from: Home              Anticipated d/c is to: Undetermined at this time, home with home health versus assisted living facility                 Severity of Illness: The appropriate patient status for this patient is OBSERVATION. Observation status is judged to be reasonable and necessary in order to provide the required intensity of service to ensure the patient's safety. The patient's presenting symptoms, physical exam findings, and initial radiographic and laboratory data in the context of their medical condition is felt to place them at decreased risk for further clinical deterioration.  Furthermore, it is anticipated that the patient will be medically stable for discharge from the hospital within 2 midnights of admission.    Signed, Flora Lipps, MD [facility] 07/09/2020

## 2020-07-09 NOTE — ED Provider Notes (Signed)
[facility] DEPT Provider Note   CSN: [id number] Arrival date & time: 07/09/20  1424     History Chief Complaint  Patient presents with  . Fall  . Hip Pain    Kimberly English is a 86 y.o. female.  86 year old female here for evaluation of hip pain and fall.  She said she turned quickly had pain in her hip and then fell to the ground.  She denies any loss of consciousness.  She does not think she hit her head.  Complaining of some pain in her right hip and swelling in her ankles.  No numbness or weakness.  Has not ambulated since fall.  Not on blood thinners.  The history is provided by the patient.  Fall This is a new problem. The problem has not changed since onset.Pertinent negatives include no chest pain, no abdominal pain, no headaches and no shortness of breath. The symptoms are aggravated by bending. The symptoms are relieved by position. She has tried nothing for the symptoms. The treatment provided no relief.  Hip Pain This is a new problem. The problem has not changed since onset.Pertinent negatives include no chest pain, no abdominal pain, no headaches and no shortness of breath. The symptoms are aggravated by bending. The symptoms are relieved by position. She has tried nothing for the symptoms. The treatment provided no relief.       Past Medical History:  Diagnosis Date  . Hypertension     Patient Active Problem List   Diagnosis Date Noted  . Hyponatremia 06/14/2012  . Hypokalemia 06/14/2012  . Confusion 06/14/2012  . Generalized weakness 06/14/2012  . Nausea 06/14/2012  . HTN (hypertension) 06/14/2012    Past Surgical History:  Procedure Laterality Date  . EYE SURGERY       OB History   No obstetric history on file.     History reviewed. No pertinent family history.  Social History   Tobacco Use  . Smoking status: Never Smoker  Substance Use Topics  . Alcohol use: No  . Drug use: No    Home  Medications Prior to Admission medications   Medication Sig Start Date End Date Taking? Authorizing Provider  cycloSPORINE (RESTASIS) 0.05 % ophthalmic emulsion Place 1 drop into both eyes 2 (two) times daily.    [provider]  mirtazapine (REMERON) 30 MG tablet Take 30 mg by mouth at bedtime.    [provider]  Multiple Vitamin (MULTIVITAMIN WITH MINERALS) TABS Take 1 tablet by mouth daily.    [provider]  omeprazole (PRILOSEC) 20 MG capsule Take 20 mg by mouth daily.    [provider]  zolpidem (AMBIEN) 10 MG tablet Take 10 mg by mouth at bedtime as needed. For sleep    [provider]    Allergies    Codeine  Review of Systems   Review of Systems  Constitutional: Negative for fever.  HENT: Negative for sore throat.   Eyes: Negative for visual disturbance.  Respiratory: Negative for shortness of breath.   Cardiovascular: Negative for chest pain.  Gastrointestinal: Negative for abdominal pain.  Genitourinary: Negative for dysuria.  Musculoskeletal: Negative for neck pain.  Skin: Negative for rash.  Neurological: Negative for headaches.    Physical Exam Updated Vital Signs BP (!) 147/67 (BP Location: Left Arm)   Pulse 71   Temp 98.3 F (36.8 C) (Oral)   Resp 16   Ht 5\' 2"  (1.575 m)   Wt 68 kg  SpO2 97%   BMI 27.44 kg/m   Physical Exam Vitals and nursing note reviewed.  Constitutional:      General: She is not in acute distress.    Appearance: Normal appearance. She is well-developed.  HENT:     Head: Normocephalic and atraumatic.  Eyes:     Conjunctiva/sclera: Conjunctivae normal.  Cardiovascular:     Rate and Rhythm: Normal rate and regular rhythm.     Heart sounds: No murmur heard.   Pulmonary:     Effort: Pulmonary effort is normal. No respiratory distress.     Breath sounds: Normal breath sounds.  Abdominal:     Palpations: Abdomen is soft.     Tenderness: There is no abdominal tenderness.   Musculoskeletal:        General: Tenderness present. No deformity or signs of injury.     Cervical back: Neck supple.     Right lower leg: Edema present.     Left lower leg: Edema present.     Comments: She has some vague tenderness in her right hip and right knee.  Both ankles are swollen.  Full range of motion of upper extremities and left lower extremity without any pain or limitations.  No neck or back pain.  Skin:    General: Skin is warm and dry.  Neurological:     General: No focal deficit present.     Mental Status: She is alert.     ED Results / Procedures / Treatments   Labs (all labs ordered are listed, but only abnormal results are displayed) Labs Reviewed  BASIC METABOLIC PANEL - Abnormal; Notable for the following components:      Result Value   Glucose, Bld 109 (*)    All other components within normal limits  CBC WITH DIFFERENTIAL/PLATELET - Abnormal; Notable for the following components:   Platelets 123 (*)    All other components within normal limits  BASIC METABOLIC PANEL - Abnormal; Notable for the following components:   Calcium 8.7 (*)    All other components within normal limits  CBC - Abnormal; Notable for the following components:   Platelets 117 (*)    All other components within normal limits  RESPIRATORY PANEL BY RT PCR (FLU A&B, COVID)    EKG None  Radiology CT Hip Right Wo Contrast  Result Date: 07/09/2020 CLINICAL DATA:  Hip trauma, pain EXAM: CT OF THE RIGHT HIP WITHOUT CONTRAST TECHNIQUE: Multidetector CT imaging of the right hip was performed according to the standard protocol. Multiplanar CT image reconstructions were also generated. COMPARISON:  07/09/2020 FINDINGS: Bones/Joint/Cartilage There is a minimally displaced oblique fracture through the right superior pubic ramus extending to the pubic symphysis. There are no other acute displaced fractures. Specifically, visualized portions of the right femur are unremarkable. There is moderate  right hip osteoarthritis.  No joint effusion. Ligaments Suboptimally assessed by CT. Muscles and Tendons Unremarkable. Soft tissues Mild soft tissue swelling adjacent to the right superior pubic ramus consistent with fracture described above. Remaining soft tissues are unremarkable. No free fluid within the pelvis. Diverticulosis of the sigmoid colon without diverticulitis. IMPRESSION: 1. Minimally displaced oblique fracture through the right superior pubic ramus, extending to the pubic symphysis. 2. Moderate right hip osteoarthritis. Electronically Signed   By: Randa Ngo M.D.   On: 07/09/2020 17:25   DG Knee Complete 4 Views Right  Result Date: 07/09/2020 CLINICAL DATA:  [place], pain EXAM: RIGHT KNEE - COMPLETE 4+ VIEW COMPARISON:  None. FINDINGS: Frontal, bilateral  oblique, lateral views of the right knee are obtained. No fracture, subluxation, or dislocation. There is moderate 3 compartmental osteoarthritis, with joint space narrowing chondrocalcinosis seen within the medial and lateral compartments. No joint effusion. IMPRESSION: 1. Three compartmental osteoarthritis.  No acute fracture. Electronically Signed   By: Randa Ngo M.D.   On: 07/09/2020 16:10   DG Hip Unilat With Pelvis 2-3 Views Right  Result Date: 07/09/2020 CLINICAL DATA:  [place], pain EXAM: DG HIP (WITH OR WITHOUT PELVIS) 2-3V RIGHT COMPARISON:  None. FINDINGS: Frontal view of the pelvis as well as frontal and cross-table lateral views of the right hip are obtained. No acute displaced fracture. Symmetrical bilateral hip osteoarthritis. Sacroiliac joints are unremarkable. The soft tissues are normal. IMPRESSION: 1. Osteoarthritis.  No acute displaced fracture. Electronically Signed   By: Randa Ngo M.D.   On: 07/09/2020 16:14    Procedures Procedures (including critical care time)  Medications Ordered in ED Medications  enoxaparin (LOVENOX) injection 40 mg (40 mg Subcutaneous Given 07/09/20 2152)  docusate sodium  (COLACE) capsule 100 mg (100 mg Oral Given 07/10/20 0924)  polyethylene glycol (MIRALAX / GLYCOLAX) packet 17 g (has no administration in time range)  metoprolol succinate (TOPROL-XL) 24 hr tablet 25 mg (25 mg Oral Given 07/10/20 0923)  LORazepam (ATIVAN) tablet 0.5 mg (0.5 mg Oral Given 07/10/20 0043)  mirtazapine (REMERON) tablet 15 mg (15 mg Oral Given 07/09/20 2153)  gabapentin (NEURONTIN) capsule 100 mg (100 mg Oral Given 07/10/20 0924)  multivitamin with minerals tablet 1 tablet (1 tablet Oral Given 07/10/20 0924)  HYDROcodone-acetaminophen (NORCO/VICODIN) 5-325 MG per tablet 1 tablet (1 tablet Oral Given 07/09/20 2152)  ondansetron (ZOFRAN) injection 4 mg (has no administration in time range)  HYDROmorphone (DILAUDID) injection 0.5 mg (has no administration in time range)  fentaNYL (SUBLIMAZE) injection 50 mcg (50 mcg Intravenous Given 07/09/20 1835)    ED Course  I have reviewed the triage vital signs and the nursing notes.  Pertinent labs & imaging results that were available during my care of the patient were reviewed by me and considered in my medical decision making (see chart for details).  Clinical Course as of Jul 11 1027  Sat Jul 09, 2020  1625 X-rays of right hip pelvis and right knee ordered and interpreted by me as arthritis no acute fractures.   [MB]  2585 CT hip interpreted by me as possible pelvic rami fracture.  Awaiting radiology reading.   [MB]  31 Discussed with orthopedics Dr. Doreatha Martin.  He is going to review the images but he said the patient likely is going to be weightbearing as tolerated.  Due to her age I feel she will likely need to be admitted to the medical service and get PT to see her, pain control.   [MB]  1822 After getting permission from the patient I called her son Aliyha Fornes and updated him on her current status.   [MB]  1844 Dr. Doreatha Martin put a brief note in the patient's chart as follows - Case reviewed with Dr. Melina Copa. 86 yo w/ R hip pain.  CT scan of hip shows parasymphseal pubic ramus fracture. Patient can be WBAT. No surgical intervention. Recommend admission to hospitalist for pain control and mobilization with PT. Ortho to consult tomorrow.   [MB]  1945 Discussed with [facility] hospitalist Dr. Louanne Belton who will evaluate the patient for admission   [MB]    Clinical Course User Index [MB] Hayden Rasmussen, MD   MDM Rules/Calculators/A&P  This patient complains of fall and right hip pain; this involves an extensive number of treatment Options and is a complaint that carries with it a high risk of complications and Morbidity. The differential includes fracture, dislocation, contusion  I ordered, reviewed and interpreted labs, which included CBC with normal white count normal hemoglobin, platelets mildly low, new from prior, chemistries fairly normal, Covid testing negative I ordered medication IV pain medicine with good relief of her pain I ordered imaging studies which included right hip and knee x-rays and CT right hip and I independently    visualized and interpreted imaging which showed right superior rami fracture Additional history provided by patient's son contacted by phone Previous records obtained and reviewed in epic, no recent admissions I consulted orthopedics Dr. Doreatha Martin and [facility] hospitalist Dr. Louanne Belton and discussed lab and imaging findings  Critical Interventions: None  After the interventions stated above, I reevaluated the patient and found patient to be resting comfortably but has pain with any type of manipulation and unable to stand.  Will need admission to the hospital for physical therapy pain control.  Orthopedics said they would put a formal consult down tomorrow.  Family updated.   Final Clinical Impression(s) / ED Diagnoses Final diagnoses:  Closed fracture of superior pubic ramus, right, initial encounter ([place])  Fall, initial encounter  Thrombocytopenia [facility])    Rx / DC  Orders ED Discharge Orders    None       Hayden Rasmussen, MD 07/10/20 [phone]

## 2020-07-10 ENCOUNTER — Other Ambulatory Visit: Payer: Self-pay

## 2020-07-10 ENCOUNTER — Encounter (HOSPITAL_COMMUNITY): Payer: Self-pay | Admitting: Internal Medicine

## 2020-07-10 DIAGNOSIS — R339 Retention of urine, unspecified: Secondary | ICD-10-CM | POA: Diagnosis not present

## 2020-07-10 DIAGNOSIS — Z66 Do not resuscitate: Secondary | ICD-10-CM | POA: Diagnosis present

## 2020-07-10 DIAGNOSIS — M1711 Unilateral primary osteoarthritis, right knee: Secondary | ICD-10-CM | POA: Diagnosis not present

## 2020-07-10 DIAGNOSIS — S32501D Unspecified fracture of right pubis, subsequent encounter for fracture with routine healing: Secondary | ICD-10-CM | POA: Diagnosis not present

## 2020-07-10 DIAGNOSIS — S32511A Fracture of superior rim of right pubis, initial encounter for closed fracture: Secondary | ICD-10-CM | POA: Diagnosis not present

## 2020-07-10 DIAGNOSIS — Z20822 Contact with and (suspected) exposure to covid-19: Secondary | ICD-10-CM | POA: Diagnosis present

## 2020-07-10 DIAGNOSIS — M1611 Unilateral primary osteoarthritis, right hip: Secondary | ICD-10-CM | POA: Diagnosis not present

## 2020-07-10 DIAGNOSIS — W19XXXA Unspecified fall, initial encounter: Secondary | ICD-10-CM | POA: Diagnosis not present

## 2020-07-10 DIAGNOSIS — D696 Thrombocytopenia, unspecified: Secondary | ICD-10-CM | POA: Diagnosis present

## 2020-07-10 DIAGNOSIS — E46 Unspecified protein-calorie malnutrition: Secondary | ICD-10-CM | POA: Diagnosis not present

## 2020-07-10 DIAGNOSIS — M6281 Muscle weakness (generalized): Secondary | ICD-10-CM | POA: Diagnosis not present

## 2020-07-10 DIAGNOSIS — Z885 Allergy status to narcotic agent status: Secondary | ICD-10-CM | POA: Diagnosis not present

## 2020-07-10 DIAGNOSIS — S329XXD Fracture of unspecified parts of lumbosacral spine and pelvis, subsequent encounter for fracture with routine healing: Secondary | ICD-10-CM | POA: Diagnosis not present

## 2020-07-10 DIAGNOSIS — K5900 Constipation, unspecified: Secondary | ICD-10-CM | POA: Diagnosis not present

## 2020-07-10 DIAGNOSIS — S329XXA Fracture of unspecified parts of lumbosacral spine and pelvis, initial encounter for closed fracture: Secondary | ICD-10-CM | POA: Diagnosis not present

## 2020-07-10 DIAGNOSIS — F419 Anxiety disorder, unspecified: Secondary | ICD-10-CM | POA: Diagnosis present

## 2020-07-10 DIAGNOSIS — I10 Essential (primary) hypertension: Secondary | ICD-10-CM | POA: Diagnosis present

## 2020-07-10 DIAGNOSIS — S32810A Multiple fractures of pelvis with stable disruption of pelvic ring, initial encounter for closed fracture: Secondary | ICD-10-CM | POA: Diagnosis present

## 2020-07-10 DIAGNOSIS — R296 Repeated falls: Secondary | ICD-10-CM | POA: Diagnosis not present

## 2020-07-10 DIAGNOSIS — R0902 Hypoxemia: Secondary | ICD-10-CM | POA: Diagnosis not present

## 2020-07-10 DIAGNOSIS — Z7401 Bed confinement status: Secondary | ICD-10-CM | POA: Diagnosis not present

## 2020-07-10 DIAGNOSIS — Y92009 Unspecified place in unspecified non-institutional (private) residence as the place of occurrence of the external cause: Secondary | ICD-10-CM | POA: Diagnosis not present

## 2020-07-10 DIAGNOSIS — R52 Pain, unspecified: Secondary | ICD-10-CM | POA: Diagnosis not present

## 2020-07-10 DIAGNOSIS — Z5189 Encounter for other specified aftercare: Secondary | ICD-10-CM | POA: Diagnosis not present

## 2020-07-10 DIAGNOSIS — R627 Adult failure to thrive: Secondary | ICD-10-CM | POA: Diagnosis not present

## 2020-07-10 DIAGNOSIS — M2550 Pain in unspecified joint: Secondary | ICD-10-CM | POA: Diagnosis not present

## 2020-07-10 DIAGNOSIS — W010XXA Fall on same level from slipping, tripping and stumbling without subsequent striking against object, initial encounter: Secondary | ICD-10-CM | POA: Diagnosis present

## 2020-07-10 DIAGNOSIS — R410 Disorientation, unspecified: Secondary | ICD-10-CM | POA: Diagnosis not present

## 2020-07-10 DIAGNOSIS — R41841 Cognitive communication deficit: Secondary | ICD-10-CM | POA: Diagnosis not present

## 2020-07-10 DIAGNOSIS — G4700 Insomnia, unspecified: Secondary | ICD-10-CM | POA: Diagnosis present

## 2020-07-10 DIAGNOSIS — Z79899 Other long term (current) drug therapy: Secondary | ICD-10-CM | POA: Diagnosis not present

## 2020-07-10 LAB — BASIC METABOLIC PANEL
Anion gap: 8 (ref 5–15)
BUN: 18 mg/dL (ref 8–23)
CO2: 24 mmol/L (ref 22–32)
Calcium: 8.7 mg/dL — ABNORMAL LOW (ref 8.9–10.3)
Chloride: 108 mmol/L (ref 98–111)
Creatinine, Ser: 0.63 mg/dL (ref 0.44–1.00)
GFR, Estimated: >60 mL/min (ref >60)
Glucose, Bld: 94 mg/dL (ref 70–99)
Potassium: 3.6 mmol/L (ref 3.5–5.1)
Sodium: 140 mmol/L (ref 135–145)

## 2020-07-10 LAB — CBC
HCT: 36.9 % (ref 36.0–46.0)
Hemoglobin: 12.5 g/dL (ref 12.0–15.0)
MCH: 32 pg (ref 26.0–34.0)
MCHC: 33.9 g/dL (ref 30.0–36.0)
MCV: 94.4 fL (ref 80.0–100.0)
Platelets: 117 10*3/uL — ABNORMAL LOW (ref 150–400)
RBC: 3.91 MIL/uL (ref 3.87–5.11)
RDW: 13.1 % (ref 11.5–15.5)
WBC: 5.2 10*3/uL (ref 4.0–10.5)
nRBC: 0 % (ref 0.0–0.2)

## 2020-07-10 NOTE — Evaluation (Signed)
Physical Therapy Evaluation Patient Details Name: Kimberly English MRN: [id number] DOB: 19-Jun-1925 Today's Date: 07/10/2020   History of Present Illness  Pt s/p fall with R minimally displaced pelvic fx.    Clinical Impression  Pt s/p pelvic fx and presents with functional mobility limitations 2* ongoing R pelvic pain increased with attempts to move or mobilize and limiting WB tolerance as well as affecting balance and safety.  Pt would benefit from follow up SNF level rehab to maximize IND and safety prior to return home with limited assist.    Follow Up Recommendations SNF    Equipment Recommendations  None recommended by PT    Recommendations for Other Services OT consult     Precautions / Restrictions Precautions Precautions: Fall Restrictions Weight Bearing Restrictions: No Other Position/Activity Restrictions: WBAT      Mobility  Bed Mobility Overal bed mobility: Needs Assistance Bed Mobility: Supine to Sit;Sit to Supine     Supine to sit: Min assist;Mod assist;+2 for safety/equipment;HOB elevated Sit to supine: Mod assist;+2 for physical assistance;+2 for safety/equipment   General bed mobility comments: Increased time with extensive use of bed rail to move to sitting.  Increased assist on return to supine for trunk support and LEs    Transfers                 General transfer comment: Pt agreeable only to tranfer to EOB sitting but unwilling to attempt standing 2* pain  Ambulation/Gait                Stairs            Wheelchair Mobility    Modified Rankin (Stroke Patients Only)       Balance Overall balance assessment: Needs assistance Sitting-balance support: Feet supported;No upper extremity supported Sitting balance-Leahy Scale: Fair                                       Pertinent Vitals/Pain Pain Assessment: 0-10 Pain Score: 7  Pain Location: R pelvic/hip Pain Descriptors / Indicators:  Aching;Grimacing;Guarding;Sore Pain Intervention(s): Limited activity within patient's tolerance;Monitored during session;Premedicated before session    [place] expects to be discharged to:: Private residence Living Arrangements: Alone Available Help at Discharge: Family;Available PRN/intermittently Type of Home: House Home Access: Stairs to enter Entrance Stairs-Rails: Right;Left Entrance Stairs-Number of Steps: 6 Home Layout: Two level;Able to live on main level with bedroom/bathroom        Prior Function Level of Independence: Needs assistance   Gait / Transfers Assistance Needed: RW  ADL's / Homemaking Assistance Needed: Pt has aide ~ 4 hrs a day, 5 days a week        Hand Dominance   Dominant Hand: Right    Extremity/Trunk Assessment   Upper Extremity Assessment Upper Extremity Assessment: Generalized weakness    Lower Extremity Assessment Lower Extremity Assessment: Generalized weakness;RLE deficits/detail RLE: Unable to fully assess due to pain    Cervical / Trunk Assessment Cervical / Trunk Assessment: Kyphotic  Communication   Communication: No difficulties  Cognition Arousal/Alertness: Awake/alert Behavior During Therapy: WFL for tasks assessed/performed Overall Cognitive Status: Within Functional Limits for tasks assessed                                        General Comments  Exercises     Assessment/Plan    PT Assessment Patient needs continued PT services  PT Problem List Decreased strength;Decreased range of motion;Decreased activity tolerance;Decreased balance;Decreased mobility;Decreased knowledge of use of DME;Pain       PT Treatment Interventions DME instruction;Gait training;Functional mobility training;Therapeutic activities;Therapeutic exercise;Balance training;Patient/family education    PT Goals (Current goals can be found in the Care Plan section)  Acute Rehab PT Goals Patient Stated  Goal: Regain IND PT Goal Formulation: With patient Time For Goal Achievement: 07/24/20 Potential to Achieve Goals: Fair    Frequency Min 3X/week   Barriers to discharge Decreased caregiver support Pt does not have 24/7 assist    Co-evaluation               AM-PAC PT "6 Clicks" Mobility  Outcome Measure Help needed turning from your back to your side while in a flat bed without using bedrails?: A Lot Help needed moving from lying on your back to sitting on the side of a flat bed without using bedrails?: A Lot Help needed moving to and from a bed to a chair (including a wheelchair)?: A Lot Help needed standing up from a chair using your arms (e.g., wheelchair or bedside chair)?: A Lot Help needed to walk in hospital room?: A Lot Help needed climbing 3-5 steps with a railing? : Total 6 Click Score: 11    End of Session Equipment Utilized During Treatment: Gait belt Activity Tolerance: Patient limited by fatigue;Patient limited by pain Patient left: in bed;with call bell/phone within reach;with bed alarm set Nurse Communication: Mobility status PT Visit Diagnosis: Difficulty in walking, not elsewhere classified (R26.2);Muscle weakness (generalized) (M62.81);Pain Pain - Right/Left: Right Pain - part of body: Hip    Time: 4967-5916 PT Time Calculation (min) (ACUTE ONLY): 18 min   Charges:   PT Evaluation $PT Eval Low Complexity: 1 Low          [place] Pager [phone] Office [phone]   Jais Demir 07/10/2020, 4:42 PM

## 2020-07-10 NOTE — Consult Note (Signed)
Orthopaedic Trauma Service (OTS) Consult   Patient ID: Kimberly English MRN: [id number] DOB/AGE: 1925-01-19 86 y.o.  Reason for Consult: Right hip pain Referring Physician: Dr. Aletta Edouard, MD [facility] ED)  HPI: Kimberly English is an 86 y.o. female being seen in consultation at request of Dr. Melina Copa for evaluation of right hip pain.  Patient turned too quickly, lost her balance and fell, and feel while at home.  She denies any loss of consciousness.  Presented to [facility] emergency department for evaluation where imaging showed right parasymphyseal pubic ramus fracture.  Orthopedic trauma service consulted who requested admission to hospitalist service for pain control and to mobilize with therapies.  Patient seen this morning on MedSurg floor. Continues have some pain in the right hip but otherwise denies pain elsewhere.  Denies any other injuries related to her fall.  Denies any numbness or tingling.  Other than hypertension, patient denies any other significant past medical history. At baseline, patient lives by herself at home. has two sons, one lives in [place], [place] and the other in [place]. She does have history of one fall in the past and uses a walker for ambulation at home.  Past Medical History:  Diagnosis Date  . Hypertension     Past Surgical History:  Procedure Laterality Date  . EYE SURGERY      History reviewed. No pertinent family history.  Social History:  reports that she has never smoked. She has never used smokeless tobacco. She reports that she does not drink alcohol and does not use drugs.  Allergies:  Allergies  Allergen Reactions  . Codeine Nausea And Vomiting    Medications:  I have reviewed the patient's current medications. Prior to Admission:  Medications Prior to Admission  Medication Sig Dispense Refill Last Dose  . gabapentin (NEURONTIN) 100 MG capsule Take 100 mg by mouth 3 (three) times daily.   07/08/2020 at Unknown time  .  LORazepam (ATIVAN) 0.5 MG tablet Take 0.5 mg by mouth 2 (two) times daily as needed for anxiety.    07/08/2020 at Unknown time  . metoprolol succinate (TOPROL-XL) 25 MG 24 hr tablet Take 25 mg by mouth daily.    07/08/2020 at Unknown time  . mirtazapine (REMERON) 15 MG tablet Take 15 mg by mouth at bedtime.   07/08/2020 at Unknown time  . Multiple Vitamin (MULTIVITAMIN WITH MINERALS) TABS Take 1 tablet by mouth daily.   07/09/2020 at Unknown time  . zolpidem (AMBIEN) 10 MG tablet Take 5 mg by mouth at bedtime. For sleep    07/08/2020 at Unknown time    ROS: Constitutional: No fever or chills Vision: No changes in vision ENT: No difficulty swallowing CV: No chest pain Pulm: No SOB or wheezing GI: No nausea or vomiting GU: No urgency or inability to hold urine Skin: No poor wound healing Neurologic: No numbness or tingling Psychiatric: No depression or anxiety Heme: No bruising Allergic: No reaction to medications or food   Exam: Blood pressure (!) 129/57, pulse 62, temperature 97.9 F (36.6 C), temperature source Oral, resp. rate 17, height 5\' 2"  (1.575 m), weight 68 kg, SpO2 94 %. General: No acute distress, hard of hearing Orientation: Awake, alert, oriented.  Mood and Affect:  Normal mood, affect appropriate Gait: Not assessed Coordination and balance: Within normal limits  Right lower extremity: Skin without lesions. Mild tenderness with palpation over lateral hip. Hip motion intact, denies any significant discomfort with this.   Swelling distally through the  ankle and foot, noted to be baseline. Sensation intact to dorsum of foot, endorses decreased sensation on plantar surface. This is baseline for her and is equal to the contralateral foot. Compartments soft and compressible. Skin warm and dry. + DP pulse  Left lower extremity: Skin without lesions. No tenderness to palpation. Full painless ROM, full strength in each muscle groups without evidence of instability.   Medical  Decision Making: Data: Imaging: CT scan pelvis shows minimally displaced oblique fracture through the right superior pubic ramus extending to the pubic symphysis.  Labs: Results for orders placed or performed during the hospital encounter of 07/09/20 (from the past 24 hour(s))  Basic metabolic panel     Status: Abnormal   Collection Time: 07/09/20  6:46 PM  Result Value Ref Range   Sodium 141 135 - 145 mmol/L   Potassium 4.2 3.5 - 5.1 mmol/L   Chloride 106 98 - 111 mmol/L   CO2 24 22 - 32 mmol/L   Glucose, Bld 109 (H) 70 - 99 mg/dL   BUN 15 8 - 23 mg/dL   Creatinine, Ser 0.63 0.44 - 1.00 mg/dL   Calcium 9.1 8.9 - 10.3 mg/dL   GFR, Estimated >60 >60 mL/min   Anion gap 11 5 - 15  CBC with Differential     Status: Abnormal   Collection Time: 07/09/20  6:46 PM  Result Value Ref Range   WBC 7.4 4.0 - 10.5 K/uL   RBC 4.34 3.87 - 5.11 MIL/uL   Hemoglobin 13.8 12.0 - 15.0 g/dL   HCT 40.9 36 - 46 %   MCV 94.2 80.0 - 100.0 fL   MCH 31.8 26.0 - 34.0 pg   MCHC 33.7 30.0 - 36.0 g/dL   RDW 12.8 11.5 - 15.5 %   Platelets 123 (L) 150 - 400 K/uL   nRBC 0.0 0.0 - 0.2 %   Neutrophils Relative % 76 %   Neutro Abs 5.6 1.7 - 7.7 K/uL   Lymphocytes Relative 13 %   Lymphs Abs 1.0 0.7 - 4.0 K/uL   Monocytes Relative 10 %   Monocytes Absolute 0.7 0.1 - 1.0 K/uL   Eosinophils Relative 0 %   Eosinophils Absolute 0.0 0.0 - 0.5 K/uL   Basophils Relative 0 %   Basophils Absolute 0.0 0.0 - 0.1 K/uL   Immature Granulocytes 1 %   Abs Immature Granulocytes 0.04 0.00 - 0.07 K/uL  Respiratory Panel by RT PCR (Flu A&B, Covid) - Nasopharyngeal Swab     Status: None   Collection Time: 07/09/20  6:46 PM   Specimen: Nasopharyngeal Swab  Result Value Ref Range   SARS Coronavirus 2 by RT PCR NEGATIVE NEGATIVE   Influenza A by PCR NEGATIVE NEGATIVE   Influenza B by PCR NEGATIVE NEGATIVE  Basic metabolic panel     Status: Abnormal   Collection Time: 07/10/20  5:40 AM  Result Value Ref Range   Sodium 140 135  - 145 mmol/L   Potassium 3.6 3.5 - 5.1 mmol/L   Chloride 108 98 - 111 mmol/L   CO2 24 22 - 32 mmol/L   Glucose, Bld 94 70 - 99 mg/dL   BUN 18 8 - 23 mg/dL   Creatinine, Ser 0.63 0.44 - 1.00 mg/dL   Calcium 8.7 (L) 8.9 - 10.3 mg/dL   GFR, Estimated >60 >60 mL/min   Anion gap 8 5 - 15  CBC     Status: Abnormal   Collection Time: 07/10/20  5:40 AM  Result Value  Ref Range   WBC 5.2 4.0 - 10.5 K/uL   RBC 3.91 3.87 - 5.11 MIL/uL   Hemoglobin 12.5 12.0 - 15.0 g/dL   HCT 36.9 36 - 46 %   MCV 94.4 80.0 - 100.0 fL   MCH 32.0 26.0 - 34.0 pg   MCHC 33.9 30.0 - 36.0 g/dL   RDW 13.1 11.5 - 15.5 %   Platelets 117 (L) 150 - 400 K/uL   nRBC 0.0 0.0 - 0.2 %    Medical history and chart was reviewed and case discussed with medical provider.  Assessment/Plan: 86 year old female s/p fall, resulting in right pubic ramus fracture.  No surgical intervention required.  Patient may be WBAT. Continue pain control per primary team. Plan for PT evaluation today. Patient may discharge from orthopaedic standpoint once mobilizing well with therapies and cleared by medicine team. She may follow-up with Dr. Doreatha Martin in 2-3 weeks after discharge.   Mavis Fichera A. Carmie [facility] [phone] (office) [email or web address]

## 2020-07-10 NOTE — Progress Notes (Signed)
PROGRESS NOTE    Kimberly English  XHB:[id number] DOB: 1925/08/24 DOA: 07/09/2020 PCP: Burnard Bunting, MD    Brief Narrative:  86 year old female with history of hypertension, ambulatory dysfunction, walks with walker at home and lives by herself with external support brought to ER with ground-level fall and unable to ambulate. She was complaining of right hip pain. In the emergency room, hemodynamically stable. Hip x-rays were normal. CT scan of the right hip showed right superior pubic rami fracture extending to symphysis pubis. Patient unable to mobilize. Admitted for pain control mobility and rehab.   Assessment & Plan:   Principal Problem:   Pelvic fracture (HCC) Active Problems:   HTN (hypertension)  Close traumatic pelvic fracture: Stable. Nonoperative. Adequate pain relief Mobility with PT OT, weightbearing as tolerated Refer to inpatient therapies at a SNF.  Hypertension: Blood pressure stable. On metoprolol.  Insomnia: Takes mirtazapine at home. Continue.  Chronic thrombocytopenia: Stable.   DVT prophylaxis: enoxaparin (LOVENOX) injection 40 mg Start: 07/09/20 2200   Code Status: DNR Family Communication: Patient's son on the phone Disposition Plan: Status is: Observation  The patient will require care spanning > 2 midnights and should be moved to inpatient because: Unsafe d/c plan  Dispo: The patient is from: Home              Anticipated d/c is to: SNF              Anticipated d/c date is: 2 days              Patient currently is not medically stable to d/c.         Consultants:   Orthopedics  Procedures:   None  Antimicrobials:   None   Subjective: Patient seen and examined. Poor historian. She was not sure she can walk. Complains of pain on movement on her right hip.  Objective: Vitals:   07/09/20 2059 07/10/20 0119 07/10/20 0612 07/10/20 0941  BP: (!) 145/56 (!) 118/33 (!) 129/57 (!) 139/54  Pulse: 75 64 62 64  Resp: 16 16 17  14   Temp: 98.4 F (36.9 C) 97.9 F (36.6 C)  97.8 F (36.6 C)  TempSrc: Oral Oral  Oral  SpO2: 99% 97% 94%   Weight:      Height:        Intake/Output Summary (Last 24 hours) at 07/10/2020 1104 Last data filed at 07/10/2020 0900 Gross per 24 hour  Intake 120 ml  Output --  Net 120 ml   Filed Weights   07/09/20 1441  Weight: 68 kg    Examination:  General exam: Appears calm and comfortable Frail looking, appropriate for age. Respiratory system: Clear to auscultation. Respiratory effort normal. Cardiovascular system: S1 & S2 heard, RRR.  Gastrointestinal system: Soft and nontender. Bowel sounds present.  Central nervous system: Alert and oriented. No focal neurological deficits. Extremities: Symmetric 5 x 5 power.   Data Reviewed: I have personally reviewed following labs and imaging studies  CBC: Recent Labs  Lab 07/09/20 1846 07/10/20 0540  WBC 7.4 5.2  NEUTROABS 5.6  --   HGB 13.8 12.5  HCT 40.9 36.9  MCV 94.2 94.4  PLT 123* 810*   Basic Metabolic Panel: Recent Labs  Lab 07/09/20 1846 07/10/20 0540  NA 141 140  K 4.2 3.6  CL 106 108  CO2 24 24  GLUCOSE 109* 94  BUN 15 18  CREATININE 0.63 0.63  CALCIUM 9.1 8.7*   GFR: Estimated Creatinine Clearance: 39.7 mL/min (by  C-G formula based on SCr of 0.63 mg/dL). Liver Function Tests: No results for input(s): AST, ALT, ALKPHOS, BILITOT, PROT, ALBUMIN in the last 168 hours. No results for input(s): LIPASE, AMYLASE in the last 168 hours. No results for input(s): AMMONIA in the last 168 hours. Coagulation Profile: No results for input(s): INR, PROTIME in the last 168 hours. Cardiac Enzymes: No results for input(s): CKTOTAL, CKMB, CKMBINDEX, TROPONINI in the last 168 hours. BNP (last 3 results) No results for input(s): PROBNP in the last 8760 hours. HbA1C: No results for input(s): HGBA1C in the last 72 hours. CBG: No results for input(s): GLUCAP in the last 168 hours. Lipid Profile: No results for  input(s): CHOL, HDL, LDLCALC, TRIG, CHOLHDL, LDLDIRECT in the last 72 hours. Thyroid Function Tests: No results for input(s): TSH, T4TOTAL, FREET4, T3FREE, THYROIDAB in the last 72 hours. Anemia Panel: No results for input(s): VITAMINB12, FOLATE, FERRITIN, TIBC, IRON, RETICCTPCT in the last 72 hours. Sepsis Labs: No results for input(s): PROCALCITON, LATICACIDVEN in the last 168 hours.  Recent Results (from the past 240 hour(s))  Respiratory Panel by RT PCR (Flu A&B, Covid) - Nasopharyngeal Swab     Status: None   Collection Time: 07/09/20  6:46 PM   Specimen: Nasopharyngeal Swab  Result Value Ref Range Status   SARS Coronavirus 2 by RT PCR NEGATIVE NEGATIVE Final    Comment: (NOTE) SARS-CoV-2 target nucleic acids are NOT DETECTED.  The SARS-CoV-2 RNA is generally detectable in upper respiratoy specimens during the acute phase of infection. The lowest concentration of SARS-CoV-2 viral copies this assay can detect is 131 copies/mL. A negative result does not preclude SARS-Cov-2 infection and should not be used as the sole basis for treatment or other patient management decisions. A negative result may occur with  improper specimen collection/handling, submission of specimen other than nasopharyngeal swab, presence of viral mutation(s) within the areas targeted by this assay, and inadequate number of viral copies (<131 copies/mL). A negative result must be combined with clinical observations, patient history, and epidemiological information. The expected result is Negative.  Fact Sheet for Patients:  PinkCheek.be  Fact Sheet for Healthcare Providers:  GravelBags.it  This test is no t yet approved or cleared by the [place] FDA and  has been authorized for detection and/or diagnosis of SARS-CoV-2 by FDA under an Emergency Use Authorization (EUA). This EUA will remain  in effect (meaning this test can be used) for the  duration of the COVID-19 declaration under Section 564(b)(1) of the Act, 21 U.S.C. section 360bbb-3(b)(1), unless the authorization is terminated or revoked sooner.     Influenza A by PCR NEGATIVE NEGATIVE Final   Influenza B by PCR NEGATIVE NEGATIVE Final    Comment: (NOTE) The Xpert Xpress SARS-CoV-2/FLU/RSV assay is intended as an aid in  the diagnosis of influenza from Nasopharyngeal swab specimens and  should not be used as a sole basis for treatment. Nasal washings and  aspirates are unacceptable for Xpert Xpress SARS-CoV-2/FLU/RSV  testing.  Fact Sheet for Patients: PinkCheek.be  Fact Sheet for Healthcare Providers: GravelBags.it  This test is not yet approved or cleared by the [place] FDA and  has been authorized for detection and/or diagnosis of SARS-CoV-2 by  FDA under an Emergency Use Authorization (EUA). This EUA will remain  in effect (meaning this test can be used) for the duration of the  Covid-19 declaration under Section 564(b)(1) of the Act, 21  U.S.C. section 360bbb-3(b)(1), unless the authorization is  terminated or revoked. Performed  at [facility], [place] [address] [place], [place] [address]          Radiology Studies: CT Hip Right Wo Contrast  Result Date: 07/09/2020 CLINICAL DATA:  Hip trauma, pain EXAM: CT OF THE RIGHT HIP WITHOUT CONTRAST TECHNIQUE: Multidetector CT imaging of the right hip was performed according to the standard protocol. Multiplanar CT image reconstructions were also generated. COMPARISON:  07/09/2020 FINDINGS: Bones/Joint/Cartilage There is a minimally displaced oblique fracture through the right superior pubic ramus extending to the pubic symphysis. There are no other acute displaced fractures. Specifically, visualized portions of the right femur are unremarkable. There is moderate right hip osteoarthritis.  No joint effusion. Ligaments Suboptimally  assessed by CT. Muscles and Tendons Unremarkable. Soft tissues Mild soft tissue swelling adjacent to the right superior pubic ramus consistent with fracture described above. Remaining soft tissues are unremarkable. No free fluid within the pelvis. Diverticulosis of the sigmoid colon without diverticulitis. IMPRESSION: 1. Minimally displaced oblique fracture through the right superior pubic ramus, extending to the pubic symphysis. 2. Moderate right hip osteoarthritis. Electronically Signed   By: Randa Ngo M.D.   On: 07/09/2020 17:25   DG Knee Complete 4 Views Right  Result Date: 07/09/2020 CLINICAL DATA:  [place], pain EXAM: RIGHT KNEE - COMPLETE 4+ VIEW COMPARISON:  None. FINDINGS: Frontal, bilateral oblique, lateral views of the right knee are obtained. No fracture, subluxation, or dislocation. There is moderate 3 compartmental osteoarthritis, with joint space narrowing chondrocalcinosis seen within the medial and lateral compartments. No joint effusion. IMPRESSION: 1. Three compartmental osteoarthritis.  No acute fracture. Electronically Signed   By: Randa Ngo M.D.   On: 07/09/2020 16:10   DG Hip Unilat With Pelvis 2-3 Views Right  Result Date: 07/09/2020 CLINICAL DATA:  [place], pain EXAM: DG HIP (WITH OR WITHOUT PELVIS) 2-3V RIGHT COMPARISON:  None. FINDINGS: Frontal view of the pelvis as well as frontal and cross-table lateral views of the right hip are obtained. No acute displaced fracture. Symmetrical bilateral hip osteoarthritis. Sacroiliac joints are unremarkable. The soft tissues are normal. IMPRESSION: 1. Osteoarthritis.  No acute displaced fracture. Electronically Signed   By: Randa Ngo M.D.   On: 07/09/2020 16:14        Scheduled Meds: . docusate sodium  100 mg Oral BID  . enoxaparin (LOVENOX) injection  40 mg Subcutaneous Q24H  . gabapentin  100 mg Oral TID  . metoprolol succinate  25 mg Oral Daily  . mirtazapine  15 mg Oral QHS  . multivitamin with minerals  1 tablet  Oral Daily   Continuous Infusions:   LOS: 0 days     Time spent: 25 minutes    Barb Merino, MD [facility] Pager [phone]

## 2020-07-11 DIAGNOSIS — M25551 Pain in right hip: Secondary | ICD-10-CM | POA: Diagnosis not present

## 2020-07-11 DIAGNOSIS — W19XXXD Unspecified fall, subsequent encounter: Secondary | ICD-10-CM | POA: Diagnosis not present

## 2020-07-11 DIAGNOSIS — E46 Unspecified protein-calorie malnutrition: Secondary | ICD-10-CM | POA: Diagnosis not present

## 2020-07-11 DIAGNOSIS — R5381 Other malaise: Secondary | ICD-10-CM | POA: Diagnosis not present

## 2020-07-11 DIAGNOSIS — R0902 Hypoxemia: Secondary | ICD-10-CM | POA: Diagnosis not present

## 2020-07-11 DIAGNOSIS — S32501D Unspecified fracture of right pubis, subsequent encounter for fracture with routine healing: Secondary | ICD-10-CM | POA: Diagnosis not present

## 2020-07-11 DIAGNOSIS — H04123 Dry eye syndrome of bilateral lacrimal glands: Secondary | ICD-10-CM | POA: Diagnosis not present

## 2020-07-11 DIAGNOSIS — R339 Retention of urine, unspecified: Secondary | ICD-10-CM | POA: Diagnosis not present

## 2020-07-11 DIAGNOSIS — R52 Pain, unspecified: Secondary | ICD-10-CM | POA: Diagnosis not present

## 2020-07-11 DIAGNOSIS — S329XXD Fracture of unspecified parts of lumbosacral spine and pelvis, subsequent encounter for fracture with routine healing: Secondary | ICD-10-CM | POA: Diagnosis not present

## 2020-07-11 DIAGNOSIS — W19XXXA Unspecified fall, initial encounter: Secondary | ICD-10-CM | POA: Diagnosis not present

## 2020-07-11 DIAGNOSIS — F419 Anxiety disorder, unspecified: Secondary | ICD-10-CM | POA: Diagnosis not present

## 2020-07-11 DIAGNOSIS — S32511A Fracture of superior rim of right pubis, initial encounter for closed fracture: Secondary | ICD-10-CM | POA: Diagnosis not present

## 2020-07-11 DIAGNOSIS — R0782 Intercostal pain: Secondary | ICD-10-CM | POA: Diagnosis not present

## 2020-07-11 DIAGNOSIS — Z5189 Encounter for other specified aftercare: Secondary | ICD-10-CM | POA: Diagnosis not present

## 2020-07-11 DIAGNOSIS — D693 Immune thrombocytopenic purpura: Secondary | ICD-10-CM | POA: Diagnosis not present

## 2020-07-11 DIAGNOSIS — Z7401 Bed confinement status: Secondary | ICD-10-CM | POA: Diagnosis not present

## 2020-07-11 DIAGNOSIS — R0781 Pleurodynia: Secondary | ICD-10-CM | POA: Diagnosis not present

## 2020-07-11 DIAGNOSIS — R41841 Cognitive communication deficit: Secondary | ICD-10-CM | POA: Diagnosis not present

## 2020-07-11 DIAGNOSIS — M2550 Pain in unspecified joint: Secondary | ICD-10-CM | POA: Diagnosis not present

## 2020-07-11 DIAGNOSIS — R410 Disorientation, unspecified: Secondary | ICD-10-CM | POA: Diagnosis not present

## 2020-07-11 DIAGNOSIS — R262 Difficulty in walking, not elsewhere classified: Secondary | ICD-10-CM | POA: Diagnosis not present

## 2020-07-11 DIAGNOSIS — K5900 Constipation, unspecified: Secondary | ICD-10-CM | POA: Diagnosis not present

## 2020-07-11 DIAGNOSIS — R300 Dysuria: Secondary | ICD-10-CM | POA: Diagnosis not present

## 2020-07-11 DIAGNOSIS — K5909 Other constipation: Secondary | ICD-10-CM | POA: Diagnosis not present

## 2020-07-11 DIAGNOSIS — S32591D Other specified fracture of right pubis, subsequent encounter for fracture with routine healing: Secondary | ICD-10-CM | POA: Diagnosis not present

## 2020-07-11 DIAGNOSIS — R296 Repeated falls: Secondary | ICD-10-CM | POA: Diagnosis not present

## 2020-07-11 DIAGNOSIS — M1611 Unilateral primary osteoarthritis, right hip: Secondary | ICD-10-CM | POA: Diagnosis not present

## 2020-07-11 DIAGNOSIS — G4700 Insomnia, unspecified: Secondary | ICD-10-CM | POA: Diagnosis not present

## 2020-07-11 DIAGNOSIS — I10 Essential (primary) hypertension: Secondary | ICD-10-CM | POA: Diagnosis not present

## 2020-07-11 DIAGNOSIS — R82998 Other abnormal findings in urine: Secondary | ICD-10-CM | POA: Diagnosis not present

## 2020-07-11 DIAGNOSIS — R630 Anorexia: Secondary | ICD-10-CM | POA: Diagnosis not present

## 2020-07-11 DIAGNOSIS — R627 Adult failure to thrive: Secondary | ICD-10-CM | POA: Diagnosis not present

## 2020-07-11 DIAGNOSIS — M1711 Unilateral primary osteoarthritis, right knee: Secondary | ICD-10-CM | POA: Diagnosis not present

## 2020-07-11 DIAGNOSIS — M6281 Muscle weakness (generalized): Secondary | ICD-10-CM | POA: Diagnosis not present

## 2020-07-11 DIAGNOSIS — N319 Neuromuscular dysfunction of bladder, unspecified: Secondary | ICD-10-CM | POA: Diagnosis not present

## 2020-07-11 LAB — URINALYSIS, ROUTINE W REFLEX MICROSCOPIC
Bilirubin Urine: NEGATIVE
Glucose, UA: NEGATIVE mg/dL
Hgb urine dipstick: NEGATIVE
Ketones, ur: NEGATIVE mg/dL
Leukocytes,Ua: NEGATIVE
Nitrite: NEGATIVE
Protein, ur: NEGATIVE mg/dL
Specific Gravity, Urine: 1.01 (ref 1.005–1.030)
pH: 7 (ref 5.0–8.0)

## 2020-07-11 MED ORDER — MIRTAZAPINE 15 MG PO TABS: 15.0000 mg | ORAL_TABLET | Freq: Every day | ORAL | 0 refills | Status: DC

## 2020-07-11 MED ORDER — MIRTAZAPINE 15 MG PO TABS: 15.0000 mg | ORAL_TABLET | Freq: Every day | ORAL | 0 refills | Status: AC

## 2020-07-11 MED ORDER — LORAZEPAM 0.5 MG PO TABS: 0.5000 mg | ORAL_TABLET | Freq: Two times a day (BID) | ORAL | 0 refills | Status: AC | PRN

## 2020-07-11 MED ORDER — DOCUSATE SODIUM 100 MG PO CAPS: 100.0000 mg | ORAL_CAPSULE | Freq: Two times a day (BID) | ORAL | 0 refills | Status: AC

## 2020-07-11 MED ORDER — LORAZEPAM 0.5 MG PO TABS: 0.5000 mg | ORAL_TABLET | Freq: Two times a day (BID) | ORAL | 0 refills | Status: DC | PRN

## 2020-07-11 MED ORDER — HYDROCODONE-ACETAMINOPHEN 5-325 MG PO TABS: 1.0000 | ORAL_TABLET | Freq: Four times a day (QID) | ORAL | 0 refills | Status: DC | PRN

## 2020-07-11 MED ORDER — HYDROCODONE-ACETAMINOPHEN 5-325 MG PO TABS: 1.0000 | ORAL_TABLET | Freq: Four times a day (QID) | ORAL | 0 refills | Status: AC | PRN

## 2020-07-11 NOTE — Progress Notes (Signed)
Patient report was called into [place] at [facility]. All questions were answered and discharge instructions were placed in package for PTAR. Pt will be transferred to facility via PTAR.

## 2020-07-11 NOTE — Progress Notes (Signed)
Patient had increasing confusion overnight, attempting to climb out of bed and pulled out IV.

## 2020-07-11 NOTE — Progress Notes (Signed)
Pt confused, malodorous urine. Paged Jeannette Corpus to request sending a urine sample

## 2020-07-11 NOTE — NC FL2 (Signed)
[place] MEDICAID FL2 LEVEL OF CARE SCREENING TOOL     IDENTIFICATION  Patient Name: Kimberly English Birthdate: [birth year] Sex: female Admission Date (Current Location): 07/09/2020  [facility] and [place] Number:  Herbalist and Address:  [facility],  [place] [address], [place]      Provider Number: [id number]  Attending Physician Name and Address:  Barb Merino, MD  Relative Name and Phone Number:       Current Level of Care: Hospital Recommended Level of Care: [place] Prior Approval Number:    Date Approved/Denied:   PASRR Number: [id number]  Discharge Plan:      Current Diagnoses: Patient Active Problem List   Diagnosis Date Noted  . Pelvic fracture ([place]) 07/09/2020  . Hyponatremia 06/14/2012  . Hypokalemia 06/14/2012  . Confusion 06/14/2012  . Generalized weakness 06/14/2012  . Nausea 06/14/2012  . HTN (hypertension) 06/14/2012    Orientation RESPIRATION BLADDER Height & Weight     Self, Time, Situation, Place  Normal Continent Weight: 68 kg Height:  5\' 2"  (157.5 cm)  BEHAVIORAL SYMPTOMS/MOOD NEUROLOGICAL BOWEL NUTRITION STATUS      Continent Diet (regular)  AMBULATORY STATUS COMMUNICATION OF NEEDS Skin   Extensive Assist Verbally Normal                       Personal Care Assistance Level of Assistance  Bathing, Dressing, Feeding Bathing Assistance: Limited assistance Feeding assistance: Limited assistance Dressing Assistance: Limited assistance     Functional Limitations Info  Sight, Hearing, Speech Sight Info: Adequate Hearing Info: Adequate Speech Info: Adequate    SPECIAL CARE FACTORS FREQUENCY  PT (By licensed PT), OT (By licensed OT)     PT Frequency: 5x weekly OT Frequency: 5x weekly            Contractures Contractures Info: Not present    Additional Factors Info  Code Status Code Status Info: DNR             Current Medications (07/11/2020):  This is the  current hospital active medication list Current Facility-Administered Medications  Medication Dose Route Frequency Provider Last Rate Last Admin  . docusate sodium (COLACE) capsule 100 mg  100 mg Oral BID Pokhrel, Laxman, MD   100 mg at 07/11/20 0958  . enoxaparin (LOVENOX) injection 40 mg  40 mg Subcutaneous Q24H Pokhrel, Laxman, MD   40 mg at 07/10/20 2038  . gabapentin (NEURONTIN) capsule 100 mg  100 mg Oral TID Pokhrel, Laxman, MD   100 mg at 07/11/20 0958  . HYDROcodone-acetaminophen (NORCO/VICODIN) 5-325 MG per tablet 1 tablet  1 tablet Oral Q6H PRN Pokhrel, Laxman, MD   1 tablet at 07/10/20 2039  . HYDROmorphone (DILAUDID) injection 0.5 mg  0.5 mg Intravenous Q4H PRN Pokhrel, Laxman, MD      . LORazepam (ATIVAN) tablet 0.5 mg  0.5 mg Oral BID PRN Pokhrel, Laxman, MD   0.5 mg at 07/10/20 0043  . metoprolol succinate (TOPROL-XL) 24 hr tablet 25 mg  25 mg Oral Daily Pokhrel, Laxman, MD   25 mg at 07/11/20 0959  . mirtazapine (REMERON) tablet 15 mg  15 mg Oral QHS Pokhrel, Laxman, MD   15 mg at 07/10/20 2038  . multivitamin with minerals tablet 1 tablet  1 tablet Oral Daily Pokhrel, Laxman, MD   1 tablet at 07/11/20 0959  . ondansetron (ZOFRAN) injection 4 mg  4 mg Intravenous Q6H PRN Pokhrel, Laxman, MD      .  polyethylene glycol (MIRALAX / GLYCOLAX) packet 17 g  17 g Oral Daily PRN Pokhrel, Laxman, MD         Discharge Medications: Please see discharge summary for a list of discharge medications.  Relevant Imaging Results:  Relevant Lab Results:   Additional Information [id number]  Leeroy Cha, RN

## 2020-07-11 NOTE — TOC Initial Note (Addendum)
Transition of Care [facility]) - Initial/Assessment Note    Patient Details  Name: Kimberly English MRN: [id number] Date of Birth: [birth year]  Transition of [facility] CM/SW Contact:    Leeroy Cha, RN Phone Number: 07/11/2020, 1:38 PM  Clinical Narrative:                 dcd to snf/fo2 sent out/ tct-son prefers [facility] or [facility]. Will call both to see if a bed avilable [facility]no [facility] accepted and per the son is okay for her to go. tct-[facility]/can come today Per tct-son Shanon Brow has been vaccinated x2. Last covid test dated 111321/ ptar called at [facility], packet with moist form and scrippts to the [employer]. Expected Discharge Plan: Skilled Nursing Facility Barriers to Discharge: No Barriers Identified   Patient Goals and CMS Choice Patient states their goals for this hospitalization and ongoing recovery are:: to go home   Choice offered to / list presented to : Adult Children  Expected Discharge Plan and Services Expected Discharge Plan: [place]   Discharge Planning Services: CM Consult   Living arrangements for the past 2 months: Single Family Home Expected Discharge Date: 07/11/20                                    Prior Living Arrangements/Services Living arrangements for the past 2 months: Single Family Home Lives with:: Adult Children Patient language and need for interpreter reviewed:: Yes Do you feel safe going back to the place where you live?: Yes      Need for Family Participation in Patient Care: Yes (Comment) Care giver support system in place?: Yes (comment)   Criminal Activity/Legal Involvement Pertinent to Current Situation/Hospitalization: No - Comment as needed  Activities of Daily Living Home Assistive Devices/Equipment: Gilford Rile (specify type) ADL Screening (condition at time of admission) Patient's cognitive ability adequate to safely complete daily activities?: Yes Is  the patient deaf or have difficulty hearing?: Yes Does the patient have difficulty seeing, even when wearing glasses/contacts?: No Does the patient have difficulty concentrating, remembering, or making decisions?: No Patient able to express need for assistance with ADLs?: Yes Does the patient have difficulty dressing or bathing?: Yes Independently performs ADLs?: No Communication: Independent Dressing (OT): Needs assistance Is this a change from baseline?: Pre-admission baseline Grooming: Needs assistance Is this a change from baseline?: Pre-admission baseline Feeding: Independent Bathing: Needs assistance Is this a change from baseline?: Pre-admission baseline Toileting: Needs assistance Is this a change from baseline?: Pre-admission baseline In/Out Bed: Needs assistance Is this a change from baseline?: Pre-admission baseline Walks in Home: Needs assistance Is this a change from baseline?: Pre-admission baseline Does the patient have difficulty walking or climbing stairs?: Yes Weakness of Legs: Both Weakness of Arms/Hands: None  Permission Sought/Granted                  Emotional Assessment Appearance:: Appears stated age Attitude/Demeanor/Rapport: Engaged Affect (typically observed): Calm Orientation: : Oriented to Place, Oriented to Self, Oriented to  Time, Oriented to Situation Alcohol / Substance Use: Not Applicable Psych Involvement: No (comment)  Admission diagnosis:  Pelvic fracture ([place]) [S32.9XXA] Fall, initial encounter [id number]] Closed fracture of superior pubic ramus, right, initial encounter [facility]) [S32.511A] Patient Active Problem List   Diagnosis Date Noted  . Pelvic fracture ([place]) 07/09/2020  . Hyponatremia 06/14/2012  . Hypokalemia 06/14/2012  . Confusion 06/14/2012  .  Generalized weakness 06/14/2012  . Nausea 06/14/2012  . HTN (hypertension) 06/14/2012   PCP:  Burnard Bunting, MD Pharmacy:   [place], [place] [address] Phone: [phone] Fax: [phone]     Social Determinants of Health (SDOH) Interventions    Readmission Risk Interventions No flowsheet data found.

## 2020-07-11 NOTE — Discharge Summary (Signed)
Physician Discharge Summary  Kimberly English WPY:[id number] DOB: [birth year] DOA: 07/09/2020  PCP: Burnard Bunting, MD  Admit date: 07/09/2020 Discharge date: 07/11/2020  Admitted From: Home Disposition: Skilled nursing facility  Recommendations for Outpatient Follow-up:  1. Follow up with PCP in 1-2 weeks   Home Health: Not applicable Equipment/Devices: Not applicable  Discharge Condition: Stable CODE STATUS: DNR Diet recommendation: Regular diet  Discharge summary:  86 year old female with history of hypertension, ambulatory dysfunction, walks with walker at home and lives by herself with external support brought to ER with ground-level fall and unable to ambulate. She was complaining of right hip pain. In the emergency room, hemodynamically stable. Hip x-rays were normal. CT scan of the right hip showed right superior pubic rami fracture extending to symphysis pubis. Patient unable to mobilize. Admitted for pain control mobility and rehab.  Close traumatic pelvic fracture: Stable. Nonoperative.  Seen by orthopedic surgery. Adequate pain relief, will use Tylenol and oxycodone as needed along with laxatives. Mobility with PT OT, weightbearing as tolerated. Refer to inpatient therapies at a SNF.  Transfer when available.  Hypertension: Blood pressure stable. On metoprolol.  Insomnia: Takes mirtazapine at home. Continue.  Patient also will use Ativan as needed.  Chronic thrombocytopenia: Stable.  Patient is medically stable.  She will need inpatient therapies at a skilled nursing facility.  She can be transferred to the skilled level of care whenever bed is available.  Discharge Diagnoses:  Principal Problem:   Pelvic fracture ([place]) Active Problems:   HTN (hypertension)    Discharge Instructions  Discharge Instructions    Diet general   Complete by: As directed    Increase activity slowly   Complete by: As directed      Allergies as of 07/11/2020       Reactions   Codeine Nausea And Vomiting      Medication List    TAKE these medications   docusate sodium 100 MG capsule Commonly known as: COLACE Take 1 capsule (100 mg total) by mouth 2 (two) times daily.   gabapentin 100 MG capsule Commonly known as: NEURONTIN Take 100 mg by mouth 3 (three) times daily.   HYDROcodone-acetaminophen 5-325 MG tablet Commonly known as: NORCO/VICODIN Take 1 tablet by mouth every 6 (six) hours as needed for up to 5 days for moderate pain.   LORazepam 0.5 MG tablet Commonly known as: ATIVAN Take 1 tablet (0.5 mg total) by mouth 2 (two) times daily as needed for anxiety.   metoprolol succinate 25 MG 24 hr tablet Commonly known as: TOPROL-XL Take 25 mg by mouth daily.   mirtazapine 15 MG tablet Commonly known as: REMERON Take 1 tablet (15 mg total) by mouth at bedtime.   multivitamin with minerals Tabs tablet Take 1 tablet by mouth daily.   zolpidem 10 MG tablet Commonly known as: AMBIEN Take 5 mg by mouth at bedtime. For sleep       Follow-up Information    Haddix, Thomasene Lot, MD. Schedule an appointment as soon as possible for a visit in 3 week(s).   Specialty: Orthopedic Surgery Contact information: [place] [address] [phone]              Allergies  Allergen Reactions  . Codeine Nausea And Vomiting    Consultations:  Orthopedics   Procedures/Studies: CT Hip Right Wo Contrast  Result Date: 07/09/2020 CLINICAL DATA:  Hip trauma, pain EXAM: CT OF THE RIGHT HIP WITHOUT CONTRAST TECHNIQUE: Multidetector CT imaging of the right  hip was performed according to the standard protocol. Multiplanar CT image reconstructions were also generated. COMPARISON:  07/09/2020 FINDINGS: Bones/Joint/Cartilage There is a minimally displaced oblique fracture through the right superior pubic ramus extending to the pubic symphysis. There are no other acute displaced fractures. Specifically, visualized portions of the right  femur are unremarkable. There is moderate right hip osteoarthritis.  No joint effusion. Ligaments Suboptimally assessed by CT. Muscles and Tendons Unremarkable. Soft tissues Mild soft tissue swelling adjacent to the right superior pubic ramus consistent with fracture described above. Remaining soft tissues are unremarkable. No free fluid within the pelvis. Diverticulosis of the sigmoid colon without diverticulitis. IMPRESSION: 1. Minimally displaced oblique fracture through the right superior pubic ramus, extending to the pubic symphysis. 2. Moderate right hip osteoarthritis. Electronically Signed   By: Randa Ngo M.D.   On: 07/09/2020 17:25   DG Knee Complete 4 Views Right  Result Date: 07/09/2020 CLINICAL DATA:  [place], pain EXAM: RIGHT KNEE - COMPLETE 4+ VIEW COMPARISON:  None. FINDINGS: Frontal, bilateral oblique, lateral views of the right knee are obtained. No fracture, subluxation, or dislocation. There is moderate 3 compartmental osteoarthritis, with joint space narrowing chondrocalcinosis seen within the medial and lateral compartments. No joint effusion. IMPRESSION: 1. Three compartmental osteoarthritis.  No acute fracture. Electronically Signed   By: Randa Ngo M.D.   On: 07/09/2020 16:10   DG Hip Unilat With Pelvis 2-3 Views Right  Result Date: 07/09/2020 CLINICAL DATA:  [place], pain EXAM: DG HIP (WITH OR WITHOUT PELVIS) 2-3V RIGHT COMPARISON:  None. FINDINGS: Frontal view of the pelvis as well as frontal and cross-table lateral views of the right hip are obtained. No acute displaced fracture. Symmetrical bilateral hip osteoarthritis. Sacroiliac joints are unremarkable. The soft tissues are normal. IMPRESSION: 1. Osteoarthritis.  No acute displaced fracture. Electronically Signed   By: Randa Ngo M.D.   On: 07/09/2020 16:14    (Echo, Carotid, EGD, Colonoscopy, ERCP)    Subjective: Patient seen and examined.  Overnight she had some confusion and impulsiveness, however she was been  able to be reoriented and stayed comfortable all night.  She was given a dose of Ativan that she takes at home. In the morning rounds, she has no complaints.  Denies any pain while resting.  She has not ambulated today.   Discharge Exam: Vitals:   07/10/20 2101 07/11/20 0518  BP: (!) 134/57 (!) 152/71  Pulse: 66 78  Resp: 15 16  Temp: 98.4 F (36.9 C) 98.5 F (36.9 C)  SpO2: 97% 97%   Vitals:   07/10/20 1419 07/10/20 1810 07/10/20 2101 07/11/20 0518  BP: (!) 118/55 135/74 (!) 134/57 (!) 152/71  Pulse: 63 70 66 78  Resp: 16 18 15 16   Temp: 98.7 F (37.1 C) 99 F (37.2 C) 98.4 F (36.9 C) 98.5 F (36.9 C)  TempSrc: Oral Oral Oral Oral  SpO2:   97% 97%  Weight:      Height:        General: Pt is alert, awake, not in acute distress Frail looking lady who is age-appropriate.  Comfortably sleeping in the bed. Cardiovascular: RRR, S1/S2 +, no rubs, no gallops Respiratory: CTA bilaterally, no wheezing, no rhonchi Abdominal: Soft, NT, ND, bowel sounds + Extremities: no edema, no cyanosis Patient is alert and oriented x1-2.    The results of significant diagnostics from this hospitalization (including imaging, microbiology, ancillary and laboratory) are listed below for reference.     Microbiology: Recent Results (from the past 240  hour(s))  Respiratory Panel by RT PCR (Flu A&B, Covid) - Nasopharyngeal Swab     Status: None   Collection Time: 07/09/20  6:46 PM   Specimen: Nasopharyngeal Swab  Result Value Ref Range Status   SARS Coronavirus 2 by RT PCR NEGATIVE NEGATIVE Final    Comment: (NOTE) SARS-CoV-2 target nucleic acids are NOT DETECTED.  The SARS-CoV-2 RNA is generally detectable in upper respiratoy specimens during the acute phase of infection. The lowest concentration of SARS-CoV-2 viral copies this assay can detect is 131 copies/mL. A negative result does not preclude SARS-Cov-2 infection and should not be used as the sole basis for treatment or other  patient management decisions. A negative result may occur with  improper specimen collection/handling, submission of specimen other than nasopharyngeal swab, presence of viral mutation(s) within the areas targeted by this assay, and inadequate number of viral copies (<131 copies/mL). A negative result must be combined with clinical observations, patient history, and epidemiological information. The expected result is Negative.  Fact Sheet for Patients:  PinkCheek.be  Fact Sheet for Healthcare Providers:  GravelBags.it  This test is no t yet approved or cleared by the [place] FDA and  has been authorized for detection and/or diagnosis of SARS-CoV-2 by FDA under an Emergency Use Authorization (EUA). This EUA will remain  in effect (meaning this test can be used) for the duration of the COVID-19 declaration under Section 564(b)(1) of the Act, 21 U.S.C. section 360bbb-3(b)(1), unless the authorization is terminated or revoked sooner.     Influenza A by PCR NEGATIVE NEGATIVE Final   Influenza B by PCR NEGATIVE NEGATIVE Final    Comment: (NOTE) The Xpert Xpress SARS-CoV-2/FLU/RSV assay is intended as an aid in  the diagnosis of influenza from Nasopharyngeal swab specimens and  should not be used as a sole basis for treatment. Nasal washings and  aspirates are unacceptable for Xpert Xpress SARS-CoV-2/FLU/RSV  testing.  Fact Sheet for Patients: PinkCheek.be  Fact Sheet for Healthcare Providers: GravelBags.it  This test is not yet approved or cleared by the [place] FDA and  has been authorized for detection and/or diagnosis of SARS-CoV-2 by  FDA under an Emergency Use Authorization (EUA). This EUA will remain  in effect (meaning this test can be used) for the duration of the  Covid-19 declaration under Section 564(b)(1) of the Act, 21  U.S.C. section  360bbb-3(b)(1), unless the authorization is  terminated or revoked. Performed at [facility], [place] [address] [place], [place] [address]      Labs: BNP (last 3 results) No results for input(s): BNP in the last 8760 hours. Basic Metabolic Panel: Recent Labs  Lab 07/09/20 1846 07/10/20 0540  NA 141 140  K 4.2 3.6  CL 106 108  CO2 24 24  GLUCOSE 109* 94  BUN 15 18  CREATININE 0.63 0.63  CALCIUM 9.1 8.7*   Liver Function Tests: No results for input(s): AST, ALT, ALKPHOS, BILITOT, PROT, ALBUMIN in the last 168 hours. No results for input(s): LIPASE, AMYLASE in the last 168 hours. No results for input(s): AMMONIA in the last 168 hours. CBC: Recent Labs  Lab 07/09/20 1846 07/10/20 0540  WBC 7.4 5.2  NEUTROABS 5.6  --   HGB 13.8 12.5  HCT 40.9 36.9  MCV 94.2 94.4  PLT 123* 117*   Cardiac Enzymes: No results for input(s): CKTOTAL, CKMB, CKMBINDEX, TROPONINI in the last 168 hours. BNP: Invalid input(s): POCBNP CBG: No results for input(s): GLUCAP in the last 168  hours. D-Dimer No results for input(s): DDIMER in the last 72 hours. Hgb A1c No results for input(s): HGBA1C in the last 72 hours. Lipid Profile No results for input(s): CHOL, HDL, LDLCALC, TRIG, CHOLHDL, LDLDIRECT in the last 72 hours. Thyroid function studies No results for input(s): TSH, T4TOTAL, T3FREE, THYROIDAB in the last 72 hours.  Invalid input(s): FREET3 Anemia work up No results for input(s): VITAMINB12, FOLATE, FERRITIN, TIBC, IRON, RETICCTPCT in the last 72 hours. Urinalysis    Component Value Date/Time   COLORURINE YELLOW 07/11/2020 0326   APPEARANCEUR CLEAR 07/11/2020 0326   LABSPEC 1.010 07/11/2020 0326   PHURINE 7.0 07/11/2020 0326   GLUCOSEU NEGATIVE 07/11/2020 0326   HGBUR NEGATIVE 07/11/2020 0326   BILIRUBINUR NEGATIVE 07/11/2020 0326   KETONESUR NEGATIVE 07/11/2020 0326   PROTEINUR NEGATIVE 07/11/2020 0326   NITRITE NEGATIVE 07/11/2020 0326   LEUKOCYTESUR  NEGATIVE 07/11/2020 0326   Sepsis Labs Invalid input(s): PROCALCITONIN,  WBC,  LACTICIDVEN Microbiology Recent Results (from the past 240 hour(s))  Respiratory Panel by RT PCR (Flu A&B, Covid) - Nasopharyngeal Swab     Status: None   Collection Time: 07/09/20  6:46 PM   Specimen: Nasopharyngeal Swab  Result Value Ref Range Status   SARS Coronavirus 2 by RT PCR NEGATIVE NEGATIVE Final    Comment: (NOTE) SARS-CoV-2 target nucleic acids are NOT DETECTED.  The SARS-CoV-2 RNA is generally detectable in upper respiratoy specimens during the acute phase of infection. The lowest concentration of SARS-CoV-2 viral copies this assay can detect is 131 copies/mL. A negative result does not preclude SARS-Cov-2 infection and should not be used as the sole basis for treatment or other patient management decisions. A negative result may occur with  improper specimen collection/handling, submission of specimen other than nasopharyngeal swab, presence of viral mutation(s) within the areas targeted by this assay, and inadequate number of viral copies (<131 copies/mL). A negative result must be combined with clinical observations, patient history, and epidemiological information. The expected result is Negative.  Fact Sheet for Patients:  PinkCheek.be  Fact Sheet for Healthcare Providers:  GravelBags.it  This test is no t yet approved or cleared by the [place] FDA and  has been authorized for detection and/or diagnosis of SARS-CoV-2 by FDA under an Emergency Use Authorization (EUA). This EUA will remain  in effect (meaning this test can be used) for the duration of the COVID-19 declaration under Section 564(b)(1) of the Act, 21 U.S.C. section 360bbb-3(b)(1), unless the authorization is terminated or revoked sooner.     Influenza A by PCR NEGATIVE NEGATIVE Final   Influenza B by PCR NEGATIVE NEGATIVE Final    Comment: (NOTE) The  Xpert Xpress SARS-CoV-2/FLU/RSV assay is intended as an aid in  the diagnosis of influenza from Nasopharyngeal swab specimens and  should not be used as a sole basis for treatment. Nasal washings and  aspirates are unacceptable for Xpert Xpress SARS-CoV-2/FLU/RSV  testing.  Fact Sheet for Patients: PinkCheek.be  Fact Sheet for Healthcare Providers: GravelBags.it  This test is not yet approved or cleared by the [place] FDA and  has been authorized for detection and/or diagnosis of SARS-CoV-2 by  FDA under an Emergency Use Authorization (EUA). This EUA will remain  in effect (meaning this test can be used) for the duration of the  Covid-19 declaration under Section 564(b)(1) of the Act, 21  U.S.C. section 360bbb-3(b)(1), unless the authorization is  terminated or revoked. Performed at [facility], [place] [address], [place], [place] [address]  Time coordinating discharge:  35 minutes  SIGNED:   Barb Merino, MD  [facility] 07/11/2020, 12:28 PM

## 2020-07-12 DIAGNOSIS — M25551 Pain in right hip: Secondary | ICD-10-CM | POA: Diagnosis not present

## 2020-07-12 DIAGNOSIS — M6281 Muscle weakness (generalized): Secondary | ICD-10-CM | POA: Diagnosis not present

## 2020-07-12 DIAGNOSIS — R5381 Other malaise: Secondary | ICD-10-CM | POA: Diagnosis not present

## 2020-07-12 DIAGNOSIS — R262 Difficulty in walking, not elsewhere classified: Secondary | ICD-10-CM | POA: Diagnosis not present

## 2020-07-13 DIAGNOSIS — R339 Retention of urine, unspecified: Secondary | ICD-10-CM | POA: Diagnosis not present

## 2020-07-13 DIAGNOSIS — N319 Neuromuscular dysfunction of bladder, unspecified: Secondary | ICD-10-CM | POA: Diagnosis not present

## 2020-07-13 DIAGNOSIS — R300 Dysuria: Secondary | ICD-10-CM | POA: Diagnosis not present

## 2020-07-13 DIAGNOSIS — R82998 Other abnormal findings in urine: Secondary | ICD-10-CM | POA: Diagnosis not present

## 2020-07-14 DIAGNOSIS — S32591D Other specified fracture of right pubis, subsequent encounter for fracture with routine healing: Secondary | ICD-10-CM | POA: Diagnosis not present

## 2020-07-14 DIAGNOSIS — I10 Essential (primary) hypertension: Secondary | ICD-10-CM | POA: Diagnosis not present

## 2020-07-14 DIAGNOSIS — R262 Difficulty in walking, not elsewhere classified: Secondary | ICD-10-CM | POA: Diagnosis not present

## 2020-07-14 DIAGNOSIS — D693 Immune thrombocytopenic purpura: Secondary | ICD-10-CM | POA: Diagnosis not present

## 2020-07-18 DIAGNOSIS — R296 Repeated falls: Secondary | ICD-10-CM | POA: Diagnosis not present

## 2020-07-18 DIAGNOSIS — R339 Retention of urine, unspecified: Secondary | ICD-10-CM | POA: Diagnosis not present

## 2020-07-18 DIAGNOSIS — W19XXXD Unspecified fall, subsequent encounter: Secondary | ICD-10-CM | POA: Diagnosis not present

## 2020-07-18 DIAGNOSIS — H04123 Dry eye syndrome of bilateral lacrimal glands: Secondary | ICD-10-CM | POA: Diagnosis not present

## 2020-07-18 DIAGNOSIS — R0782 Intercostal pain: Secondary | ICD-10-CM | POA: Diagnosis not present

## 2020-07-18 DIAGNOSIS — S329XXD Fracture of unspecified parts of lumbosacral spine and pelvis, subsequent encounter for fracture with routine healing: Secondary | ICD-10-CM | POA: Diagnosis not present

## 2020-07-20 DIAGNOSIS — R296 Repeated falls: Secondary | ICD-10-CM | POA: Diagnosis not present

## 2020-07-20 DIAGNOSIS — S329XXD Fracture of unspecified parts of lumbosacral spine and pelvis, subsequent encounter for fracture with routine healing: Secondary | ICD-10-CM | POA: Diagnosis not present

## 2020-07-20 DIAGNOSIS — W19XXXD Unspecified fall, subsequent encounter: Secondary | ICD-10-CM | POA: Diagnosis not present

## 2020-07-20 DIAGNOSIS — R0782 Intercostal pain: Secondary | ICD-10-CM | POA: Diagnosis not present

## 2020-07-28 DIAGNOSIS — E46 Unspecified protein-calorie malnutrition: Secondary | ICD-10-CM | POA: Diagnosis not present

## 2020-07-28 DIAGNOSIS — M6281 Muscle weakness (generalized): Secondary | ICD-10-CM | POA: Diagnosis not present

## 2020-07-28 DIAGNOSIS — R627 Adult failure to thrive: Secondary | ICD-10-CM | POA: Diagnosis not present

## 2020-07-28 DIAGNOSIS — R630 Anorexia: Secondary | ICD-10-CM | POA: Diagnosis not present

## 2020-07-28 DIAGNOSIS — M25551 Pain in right hip: Secondary | ICD-10-CM | POA: Diagnosis not present

## 2020-07-28 DIAGNOSIS — W19XXXA Unspecified fall, initial encounter: Secondary | ICD-10-CM | POA: Diagnosis not present

## 2020-08-09 DIAGNOSIS — M25551 Pain in right hip: Secondary | ICD-10-CM | POA: Diagnosis not present

## 2020-08-09 DIAGNOSIS — M6281 Muscle weakness (generalized): Secondary | ICD-10-CM | POA: Diagnosis not present

## 2020-08-09 DIAGNOSIS — R262 Difficulty in walking, not elsewhere classified: Secondary | ICD-10-CM | POA: Diagnosis not present

## 2020-08-09 DIAGNOSIS — R5381 Other malaise: Secondary | ICD-10-CM | POA: Diagnosis not present

## 2020-08-10 DIAGNOSIS — S329XXD Fracture of unspecified parts of lumbosacral spine and pelvis, subsequent encounter for fracture with routine healing: Secondary | ICD-10-CM | POA: Diagnosis not present

## 2020-08-10 DIAGNOSIS — E46 Unspecified protein-calorie malnutrition: Secondary | ICD-10-CM | POA: Diagnosis not present

## 2020-08-10 DIAGNOSIS — K5909 Other constipation: Secondary | ICD-10-CM | POA: Diagnosis not present

## 2020-08-10 DIAGNOSIS — G4700 Insomnia, unspecified: Secondary | ICD-10-CM | POA: Diagnosis not present

## 2020-08-10 DIAGNOSIS — M6281 Muscle weakness (generalized): Secondary | ICD-10-CM | POA: Diagnosis not present

## 2020-08-10 DIAGNOSIS — I10 Essential (primary) hypertension: Secondary | ICD-10-CM | POA: Diagnosis not present

## 2020-08-10 DIAGNOSIS — R627 Adult failure to thrive: Secondary | ICD-10-CM | POA: Diagnosis not present

## 2020-08-10 DIAGNOSIS — R296 Repeated falls: Secondary | ICD-10-CM | POA: Diagnosis not present

## 2020-08-12 DIAGNOSIS — M1711 Unilateral primary osteoarthritis, right knee: Secondary | ICD-10-CM | POA: Diagnosis not present

## 2020-08-12 DIAGNOSIS — G4700 Insomnia, unspecified: Secondary | ICD-10-CM | POA: Diagnosis not present

## 2020-08-12 DIAGNOSIS — R41841 Cognitive communication deficit: Secondary | ICD-10-CM | POA: Diagnosis not present

## 2020-08-12 DIAGNOSIS — R627 Adult failure to thrive: Secondary | ICD-10-CM | POA: Diagnosis not present

## 2020-08-12 DIAGNOSIS — D696 Thrombocytopenia, unspecified: Secondary | ICD-10-CM | POA: Diagnosis not present

## 2020-08-12 DIAGNOSIS — M160 Bilateral primary osteoarthritis of hip: Secondary | ICD-10-CM | POA: Diagnosis not present

## 2020-08-12 DIAGNOSIS — R296 Repeated falls: Secondary | ICD-10-CM | POA: Diagnosis not present

## 2020-08-12 DIAGNOSIS — I10 Essential (primary) hypertension: Secondary | ICD-10-CM | POA: Diagnosis not present

## 2020-08-12 DIAGNOSIS — M6281 Muscle weakness (generalized): Secondary | ICD-10-CM | POA: Diagnosis not present

## 2020-08-12 DIAGNOSIS — E46 Unspecified protein-calorie malnutrition: Secondary | ICD-10-CM | POA: Diagnosis not present

## 2020-08-12 DIAGNOSIS — S32511D Fracture of superior rim of right pubis, subsequent encounter for fracture with routine healing: Secondary | ICD-10-CM | POA: Diagnosis not present

## 2020-08-14 DIAGNOSIS — S32511D Fracture of superior rim of right pubis, subsequent encounter for fracture with routine healing: Secondary | ICD-10-CM | POA: Diagnosis not present

## 2020-08-14 DIAGNOSIS — M6281 Muscle weakness (generalized): Secondary | ICD-10-CM | POA: Diagnosis not present

## 2020-08-14 DIAGNOSIS — R296 Repeated falls: Secondary | ICD-10-CM | POA: Diagnosis not present

## 2020-08-14 DIAGNOSIS — I10 Essential (primary) hypertension: Secondary | ICD-10-CM | POA: Diagnosis not present

## 2020-08-14 DIAGNOSIS — M160 Bilateral primary osteoarthritis of hip: Secondary | ICD-10-CM | POA: Diagnosis not present

## 2020-08-14 DIAGNOSIS — M1711 Unilateral primary osteoarthritis, right knee: Secondary | ICD-10-CM | POA: Diagnosis not present

## 2020-08-15 DIAGNOSIS — M160 Bilateral primary osteoarthritis of hip: Secondary | ICD-10-CM | POA: Diagnosis not present

## 2020-08-15 DIAGNOSIS — M6281 Muscle weakness (generalized): Secondary | ICD-10-CM | POA: Diagnosis not present

## 2020-08-15 DIAGNOSIS — R296 Repeated falls: Secondary | ICD-10-CM | POA: Diagnosis not present

## 2020-08-15 DIAGNOSIS — M1711 Unilateral primary osteoarthritis, right knee: Secondary | ICD-10-CM | POA: Diagnosis not present

## 2020-08-15 DIAGNOSIS — I10 Essential (primary) hypertension: Secondary | ICD-10-CM | POA: Diagnosis not present

## 2020-08-15 DIAGNOSIS — S32511D Fracture of superior rim of right pubis, subsequent encounter for fracture with routine healing: Secondary | ICD-10-CM | POA: Diagnosis not present

## 2020-08-17 DIAGNOSIS — M160 Bilateral primary osteoarthritis of hip: Secondary | ICD-10-CM | POA: Diagnosis not present

## 2020-08-17 DIAGNOSIS — I10 Essential (primary) hypertension: Secondary | ICD-10-CM | POA: Diagnosis not present

## 2020-08-17 DIAGNOSIS — R296 Repeated falls: Secondary | ICD-10-CM | POA: Diagnosis not present

## 2020-08-17 DIAGNOSIS — M1711 Unilateral primary osteoarthritis, right knee: Secondary | ICD-10-CM | POA: Diagnosis not present

## 2020-08-17 DIAGNOSIS — M6281 Muscle weakness (generalized): Secondary | ICD-10-CM | POA: Diagnosis not present

## 2020-08-17 DIAGNOSIS — S32511D Fracture of superior rim of right pubis, subsequent encounter for fracture with routine healing: Secondary | ICD-10-CM | POA: Diagnosis not present

## 2020-08-20 ENCOUNTER — Encounter (HOSPITAL_COMMUNITY): Payer: Self-pay | Admitting: Emergency Medicine

## 2020-08-20 ENCOUNTER — Emergency Department (HOSPITAL_COMMUNITY)
Admission: EM | Admit: 2020-08-20 | Discharge: 2020-08-20 | Disposition: A | Discharge status: Skilled Nursing Facility | Payer: Medicare Other | Attending: Emergency Medicine | Admitting: Emergency Medicine

## 2020-08-20 ENCOUNTER — Emergency Department (HOSPITAL_COMMUNITY): Payer: Medicare Other

## 2020-08-20 ENCOUNTER — Other Ambulatory Visit: Payer: Self-pay

## 2020-08-20 DIAGNOSIS — I10 Essential (primary) hypertension: Secondary | ICD-10-CM | POA: Insufficient documentation

## 2020-08-20 DIAGNOSIS — R239 Unspecified skin changes: Secondary | ICD-10-CM | POA: Diagnosis not present

## 2020-08-20 DIAGNOSIS — M2550 Pain in unspecified joint: Secondary | ICD-10-CM | POA: Diagnosis not present

## 2020-08-20 DIAGNOSIS — Z7901 Long term (current) use of anticoagulants: Secondary | ICD-10-CM | POA: Insufficient documentation

## 2020-08-20 DIAGNOSIS — R531 Weakness: Secondary | ICD-10-CM | POA: Diagnosis not present

## 2020-08-20 DIAGNOSIS — R52 Pain, unspecified: Secondary | ICD-10-CM | POA: Diagnosis not present

## 2020-08-20 DIAGNOSIS — R457 State of emotional shock and stress, unspecified: Secondary | ICD-10-CM | POA: Diagnosis not present

## 2020-08-20 DIAGNOSIS — E876 Hypokalemia: Secondary | ICD-10-CM | POA: Diagnosis not present

## 2020-08-20 DIAGNOSIS — L8995 Pressure ulcer of unspecified site, unstageable: Secondary | ICD-10-CM | POA: Diagnosis not present

## 2020-08-20 DIAGNOSIS — J189 Pneumonia, unspecified organism: Secondary | ICD-10-CM | POA: Diagnosis not present

## 2020-08-20 DIAGNOSIS — K760 Fatty (change of) liver, not elsewhere classified: Secondary | ICD-10-CM | POA: Diagnosis not present

## 2020-08-20 DIAGNOSIS — R109 Unspecified abdominal pain: Secondary | ICD-10-CM | POA: Diagnosis not present

## 2020-08-20 DIAGNOSIS — Z79899 Other long term (current) drug therapy: Secondary | ICD-10-CM | POA: Diagnosis not present

## 2020-08-20 DIAGNOSIS — R609 Edema, unspecified: Secondary | ICD-10-CM | POA: Diagnosis not present

## 2020-08-20 DIAGNOSIS — R600 Localized edema: Secondary | ICD-10-CM | POA: Diagnosis not present

## 2020-08-20 DIAGNOSIS — F0390 Unspecified dementia without behavioral disturbance: Secondary | ICD-10-CM | POA: Insufficient documentation

## 2020-08-20 DIAGNOSIS — Z7401 Bed confinement status: Secondary | ICD-10-CM | POA: Diagnosis not present

## 2020-08-20 DIAGNOSIS — I82412 Acute embolism and thrombosis of left femoral vein: Secondary | ICD-10-CM

## 2020-08-20 DIAGNOSIS — R21 Rash and other nonspecific skin eruption: Secondary | ICD-10-CM | POA: Diagnosis not present

## 2020-08-20 DIAGNOSIS — N3000 Acute cystitis without hematuria: Secondary | ICD-10-CM

## 2020-08-20 DIAGNOSIS — J9811 Atelectasis: Secondary | ICD-10-CM | POA: Diagnosis not present

## 2020-08-20 DIAGNOSIS — R410 Disorientation, unspecified: Secondary | ICD-10-CM | POA: Diagnosis not present

## 2020-08-20 LAB — CBC WITH DIFFERENTIAL/PLATELET
Abs Immature Granulocytes: 0.06 10*3/uL (ref 0.00–0.07)
Basophils Absolute: 0 10*3/uL (ref 0.0–0.1)
Basophils Relative: 0 %
Eosinophils Absolute: 0 10*3/uL (ref 0.0–0.5)
Eosinophils Relative: 0 %
HCT: 42.7 % (ref 36.0–46.0)
Hemoglobin: 13.9 g/dL (ref 12.0–15.0)
Immature Granulocytes: 1 %
Lymphocytes Relative: 16 %
Lymphs Abs: 1.4 10*3/uL (ref 0.7–4.0)
MCH: 30.9 pg (ref 26.0–34.0)
MCHC: 32.6 g/dL (ref 30.0–36.0)
MCV: 94.9 fL (ref 80.0–100.0)
Monocytes Absolute: 0.7 10*3/uL (ref 0.1–1.0)
Monocytes Relative: 8 %
Neutro Abs: 6.3 10*3/uL (ref 1.7–7.7)
Neutrophils Relative %: 75 %
Platelets: 143 10*3/uL — ABNORMAL LOW (ref 150–400)
RBC: 4.5 MIL/uL (ref 3.87–5.11)
RDW: 14.6 % (ref 11.5–15.5)
WBC: 8.4 10*3/uL (ref 4.0–10.5)
nRBC: 0 % (ref 0.0–0.2)

## 2020-08-20 LAB — URINALYSIS, ROUTINE W REFLEX MICROSCOPIC
Bilirubin Urine: NEGATIVE
Glucose, UA: NEGATIVE mg/dL
Hgb urine dipstick: NEGATIVE
Ketones, ur: NEGATIVE mg/dL
Nitrite: POSITIVE — AB
Protein, ur: NEGATIVE mg/dL
Specific Gravity, Urine: 1.019 (ref 1.005–1.030)
WBC, UA: >50 WBC/hpf — ABNORMAL HIGH (ref 0–5)
pH: 6 (ref 5.0–8.0)

## 2020-08-20 LAB — BRAIN NATRIURETIC PEPTIDE: B Natriuretic Peptide: 137.2 pg/mL — ABNORMAL HIGH (ref 0.0–100.0)

## 2020-08-20 LAB — COMPREHENSIVE METABOLIC PANEL
ALT: 36 U/L (ref 0–44)
AST: 73 U/L — ABNORMAL HIGH (ref 15–41)
Albumin: 2.5 g/dL — ABNORMAL LOW (ref 3.5–5.0)
Alkaline Phosphatase: 115 U/L (ref 38–126)
Anion gap: 9 (ref 5–15)
BUN: 21 mg/dL (ref 8–23)
CO2: 29 mmol/L (ref 22–32)
Calcium: 8.4 mg/dL — ABNORMAL LOW (ref 8.9–10.3)
Chloride: 96 mmol/L — ABNORMAL LOW (ref 98–111)
Creatinine, Ser: 0.65 mg/dL (ref 0.44–1.00)
GFR, Estimated: >60 mL/min (ref >60)
Glucose, Bld: 89 mg/dL (ref 70–99)
Potassium: 3.7 mmol/L (ref 3.5–5.1)
Sodium: 134 mmol/L — ABNORMAL LOW (ref 135–145)
Total Bilirubin: 1.4 mg/dL — ABNORMAL HIGH (ref 0.3–1.2)
Total Protein: 6 g/dL — ABNORMAL LOW (ref 6.5–8.1)

## 2020-08-20 LAB — LACTIC ACID, PLASMA: Lactic Acid, Venous: 1.6 mmol/L (ref 0.5–1.9)

## 2020-08-20 MED ORDER — SODIUM CHLORIDE 0.9 % IV SOLN
1.0000 g | Freq: Once | INTRAVENOUS | Status: AC
  Administered 2020-08-20: 19:00:00 1 g via INTRAVENOUS
  Filled 2020-08-20: qty 10

## 2020-08-20 MED ORDER — IOHEXOL 300 MG/ML  SOLN
100.0000 mL | Freq: Once | INTRAMUSCULAR | Status: AC | PRN
  Administered 2020-08-20: 18:00:00 100 mL via INTRAVENOUS

## 2020-08-20 MED ORDER — APIXABAN 5 MG PO TABS
10.0000 mg | ORAL_TABLET | Freq: Two times a day (BID) | ORAL | Status: DC
  Administered 2020-08-20: 21:00:00 10 mg via ORAL
  Filled 2020-08-20: qty 2

## 2020-08-20 MED ORDER — CEPHALEXIN 500 MG PO CAPS: 500.0000 mg | ORAL_CAPSULE | Freq: Three times a day (TID) | ORAL | 0 refills | Status: AC

## 2020-08-20 MED ORDER — APIXABAN (ELIQUIS) EDUCATION KIT FOR DVT/PE PATIENTS: PACK | Freq: Once | Status: DC

## 2020-08-20 MED ORDER — APIXABAN (ELIQUIS) VTE STARTER PACK (10MG AND 5MG): ORAL_TABLET | ORAL | 0 refills | Status: AC

## 2020-08-20 MED ORDER — APIXABAN 5 MG PO TABS: 5.0000 mg | ORAL_TABLET | Freq: Two times a day (BID) | ORAL | Status: DC

## 2020-08-20 NOTE — Progress Notes (Signed)
ANTICOAGULATION CONSULT NOTE - Initial Consult  Pharmacy Consult for Apixaban Indication: DVT  Allergies  Allergen Reactions  . Codeine Nausea And Vomiting    Patient Measurements: Height: 5\' 2"  (157.5 cm) Weight: 68 kg (150 lb) IBW/kg (Calculated) : 50.1  Vital Signs: Temp: 98.1 F (36.7 C) (12/25 1447) Temp Source: Oral (12/25 1447) BP: 114/64 (12/25 1930) Pulse Rate: 66 (12/25 1930)  Labs: Recent Labs    08/20/20 1535  HGB 13.9  HCT 42.7  PLT 143*  CREATININE 0.65    Estimated Creatinine Clearance: 39.7 mL/min (by C-G formula based on SCr of 0.65 mg/dL).   Medical History: Past Medical History:  Diagnosis Date  . Hypertension     Assessment: 86 y/oM with dementia, HTN who presented [facility] with upper and lower extremity edema and blood in urinary catheter. CT + for left femoral vein deep venous thrombosis, bladder wall thickening and mild mucosal hyperenhancement likely related to cystitis, nonacute right pubic rami fractures. Pharmacy consulted for Apixaban dosing for DVT. Patient not on anticoagulation PTA. CBC: H/H WNL, Pltc slightly low at 143K. SCr 0.65. MD attributing blood in urinary catheter to UTI.   Plan:  Apixaban 10mg  PO BID x 7 days, then 5mg  PO BID thereafter Monitor CBC and for s/sx of bleeding   Lindell Spar, PharmD, BCPS Clinical Pharmacist  08/20/2020,8:16 PM

## 2020-08-20 NOTE — ED Notes (Signed)
[facility] called for transport.  

## 2020-08-20 NOTE — Discharge Instructions (Addendum)
Information on my medicine - ELIQUIS (apixaban)  Why was Eliquis prescribed for you? Eliquis was prescribed to treat blood clots that may have been found in the veins of your legs (deep vein thrombosis) or in your lungs (pulmonary embolism) and to reduce the risk of them occurring again.  What do You need to know about Eliquis ? The starting dose is 10 mg (two 5 mg tablets) taken TWICE daily for the FIRST SEVEN (7) DAYS, then on 08/28/2020, the dose is reduced to ONE 5 mg tablet taken TWICE daily.  Eliquis may be taken with or without food.   Try to take the dose about the same time in the morning and in the evening. If you have difficulty swallowing the tablet whole please discuss with your pharmacist how to take the medication safely.  Take Eliquis exactly as prescribed and DO NOT stop taking Eliquis without talking to the doctor who prescribed the medication.  Stopping may increase your risk of developing a new blood clot.  Refill your prescription before you run out.  After discharge, you should have regular check-up appointments with your healthcare provider that is prescribing your Eliquis.    What do you do if you miss a dose? If a dose of ELIQUIS is not taken at the scheduled time, take it as soon as possible on the same day and twice-daily administration should be resumed. The dose should not be doubled to make up for a missed dose.  Important Safety Information A possible side effect of Eliquis is bleeding. You should call your healthcare provider right away if you experience any of the following: ? Bleeding from an injury or your nose that does not stop. ? Unusual colored urine (red or dark brown) or unusual colored stools (red or black). ? Unusual bruising for unknown reasons. ? A serious fall or if you hit your head (even if there is no bleeding).  Some medicines may interact with Eliquis and might increase your risk of bleeding or clotting while on Eliquis. To help  avoid this, consult your healthcare provider or pharmacist prior to using any new prescription or non-prescription medications, including herbals, vitamins, non-steroidal anti-inflammatory drugs (NSAIDs) and supplements.  This website has more information on Eliquis (apixaban): http://www.eliquis.com/eliquis/home

## 2020-08-20 NOTE — ED Provider Notes (Signed)
[facility] DEPT Provider Note   CSN: [id number] Arrival date & time: 08/20/20  1408     History Chief Complaint  Patient presents with  . Leg Swelling    Kimberly English is a 86 y.o. female.  HPI      86yo female with history of dementia, hypertension presents from [facility] in [place] with upper and lower extremity edema, concern for possible UTI and possible wound infection.   Per pt--Broke pelvis and spine about one month ago and has been waiting to get better but not getting better. Reports she is there to recuperate. Yesterday able to walk with walker but today can't walk at all.  Reports can't move or walk today and doesn't know why.  Not sure if she is generally weak, fatigued, focal weakness, pain limiting.  Just says feeling badly and can't walk.  No chest pain, dyspnea, fevers, headache, recent falls. Leg swelling for 6 months.  Abdominal pain began this AM. Whole belly hurts. No n/v/d.  Facility reports she doesn't walk at baseline since she has been there, significant swelling, report her hx may be unreliable  Transferred from [facility] home Concern for seeping fluid from leg swelling Concern for foot infection Concern for UTI Has been there 1-2 weeks, still doesn't have all the paperwork   Past Medical History:  Diagnosis Date  . Hypertension     Patient Active Problem List   Diagnosis Date Noted  . Pelvic fracture ([place]) 07/09/2020  . Hyponatremia 06/14/2012  . Hypokalemia 06/14/2012  . Confusion 06/14/2012  . Generalized weakness 06/14/2012  . Nausea 06/14/2012  . HTN (hypertension) 06/14/2012    Past Surgical History:  Procedure Laterality Date  . EYE SURGERY       OB History   No obstetric history on file.     No family history on file.  Social History   Tobacco Use  . Smoking status: Never Smoker  . Smokeless tobacco: Never Used  Vaping Use  .  Vaping Use: Never used  Substance Use Topics  . Alcohol use: No  . Drug use: No    Home Medications Prior to Admission medications   Medication Sig Start Date End Date Taking? Authorizing Provider  APIXABAN (ELIQUIS) VTE STARTER PACK (10MG  AND 5MG ) Take as directed on package: start with two-5mg  tablets twice daily for 7 days. On day 8, switch to one-5mg  tablet twice daily. 08/20/20   Gareth Morgan, MD  cephALEXin (KEFLEX) 500 MG capsule Take 1 capsule (500 mg total) by mouth 3 (three) times daily for 7 days. 08/20/20 08/27/20  Gareth Morgan, MD  docusate sodium (COLACE) 100 MG capsule Take 1 capsule (100 mg total) by mouth 2 (two) times daily. 07/11/20   Barb Merino, MD  gabapentin (NEURONTIN) 100 MG capsule Take 100 mg by mouth 3 (three) times daily. 05/30/20   [provider]  LORazepam (ATIVAN) 0.5 MG tablet Take 1 tablet (0.5 mg total) by mouth 2 (two) times daily as needed for anxiety. 07/11/20   Barb Merino, MD  metoprolol succinate (TOPROL-XL) 25 MG 24 hr tablet Take 25 mg by mouth daily.  05/26/20   [provider]  mirtazapine (REMERON) 15 MG tablet Take 1 tablet (15 mg total) by mouth at bedtime. 07/11/20 08/10/20  Barb Merino, MD  Multiple Vitamin (MULTIVITAMIN WITH MINERALS) TABS Take 1 tablet by mouth daily.    [provider]  zolpidem (AMBIEN) 10 MG tablet Take 5  mg by mouth at bedtime. For sleep     [provider]    Allergies    Codeine  Review of Systems   Review of Systems  Constitutional: Positive for activity change, appetite change and fatigue. Negative for fever.  Respiratory: Negative for cough and shortness of breath.   Cardiovascular: Positive for leg swelling. Negative for chest pain.  Gastrointestinal: Positive for abdominal pain. Negative for constipation, diarrhea, nausea and vomiting.  Genitourinary: Difficulty urinating: catheter in place.  Musculoskeletal: Positive for back pain.  Skin: Negative for rash.   Neurological: Negative for light-headedness and headaches.    Physical Exam Updated Vital Signs BP (!) 139/113 (BP Location: Right Arm)   Pulse 80   Temp 98.5 F (36.9 C) (Oral)   Resp 15   Ht 5\' 2"  (1.575 m)   Wt 68 kg   SpO2 96%   BMI 27.44 kg/m   Physical Exam Vitals and nursing note reviewed.  Constitutional:      General: She is not in acute distress.    Appearance: She is well-developed and well-nourished. She is not diaphoretic.  HENT:     Head: Normocephalic and atraumatic.  Eyes:     Extraocular Movements: EOM normal.     Conjunctiva/sclera: Conjunctivae normal.  Cardiovascular:     Rate and Rhythm: Normal rate and regular rhythm.     Pulses: Intact distal pulses.     Heart sounds: Normal heart sounds. No murmur heard. No friction rub. No gallop.   Pulmonary:     Effort: Pulmonary effort is normal. No respiratory distress.     Breath sounds: Normal breath sounds. No wheezing or rales.  Abdominal:     General: There is no distension.     Palpations: Abdomen is soft.     Tenderness: There is abdominal tenderness. There is no guarding.  Musculoskeletal:        General: Swelling (bilateral LE 3+pitting edema, left greater than right) present. No tenderness or edema.     Cervical back: Normal range of motion.  Skin:    General: Skin is warm and dry.     Findings: No erythema or rash.  Neurological:     Mental Status: She is alert and oriented to person, place, and time.     Comments: Able to state holiday when prompted but otherwise was not oriented to date. Is to place, name.     ED Results / Procedures / Treatments   Labs (all labs ordered are listed, but only abnormal results are displayed) Labs Reviewed  CBC WITH DIFFERENTIAL/PLATELET - Abnormal; Notable for the following components:      Result Value   Platelets 143 (*)    All other components within normal limits  COMPREHENSIVE METABOLIC PANEL - Abnormal; Notable for the following components:    Sodium 134 (*)    Chloride 96 (*)    Calcium 8.4 (*)    Total Protein 6.0 (*)    Albumin 2.5 (*)    AST 73 (*)    Total Bilirubin 1.4 (*)    All other components within normal limits  BRAIN NATRIURETIC PEPTIDE - Abnormal; Notable for the following components:   B Natriuretic Peptide 137.2 (*)    All other components within normal limits  URINALYSIS, ROUTINE W REFLEX MICROSCOPIC - Abnormal; Notable for the following components:   Color, Urine AMBER (*)    APPearance CLOUDY (*)    Nitrite POSITIVE (*)    Leukocytes,Ua LARGE (*)  WBC, UA >50 (*)    Bacteria, UA MANY (*)    All other components within normal limits  URINE CULTURE  LACTIC ACID, PLASMA  LACTIC ACID, PLASMA    EKG EKG Interpretation  Date/Time:  Saturday August 20 2020 16:30:39 EST Ventricular Rate:  66 PR Interval:    QRS Duration: 106 QT Interval:  432 QTC Calculation: 453 R Axis:   -51 Text Interpretation: Sinus rhythm Left anterior fascicular block Probable left ventricular hypertrophy Anterior Q waves, possibly due to LVH No significant change since last tracing Confirmed by Gareth Morgan [id number]) on 08/20/2020 7:06:41 PM   Radiology CT Head Wo Contrast  Result Date: 08/20/2020 CLINICAL DATA:  86 year old female with delirium. EXAM: CT HEAD WITHOUT CONTRAST TECHNIQUE: Contiguous axial images were obtained from the base of the skull through the vertex without intravenous contrast. COMPARISON:  Head CT dated 06/16/2012. FINDINGS: Brain: Mild age-related atrophy and chronic microvascular ischemic changes. There is no acute intracranial hemorrhage. No mass effect midline shift no extra-axial fluid collection. Vascular: No hyperdense vessel or unexpected calcification. Skull: Normal. Negative for fracture or focal lesion. Sinuses/Orbits: No acute finding. Other: None IMPRESSION: 1. No acute intracranial pathology. 2. Mild age-related atrophy and chronic microvascular ischemic changes. Electronically Signed    By: Anner Crete M.D.   On: 08/20/2020 17:53   CT ABDOMEN PELVIS W CONTRAST  Result Date: 08/20/2020 CLINICAL DATA:  Abdominal pain. Lower extremity edema. Blood in catheter. EXAM: CT ABDOMEN AND PELVIS WITH CONTRAST TECHNIQUE: Multidetector CT imaging of the abdomen and pelvis was performed using the standard protocol following bolus administration of intravenous contrast. CONTRAST:  117mL OMNIPAQUE IOHEXOL 300 MG/ML  SOLN COMPARISON:  07/04/2018 FINDINGS: Lower chest: Bibasilar dependent airspace disease. Normal heart size with small bilateral pleural effusions. Hepatobiliary: Mild degradation secondary to patient arm position and minimal motion. Hepatic steatosis, without focal liver lesion. Cholecystectomy, without biliary ductal dilatation. Pancreas: Normal, without mass or ductal dilatation. Spleen: Normal in size, without focal abnormality. Adrenals/Urinary Tract: Normal adrenal glands. Bilateral too small to characterize renal lesions. No hydronephrosis. The bladder is collapsed around a Foley catheter. Suspect mild bladder wall thickening and mucosal hyperenhancement, including on 65/2. Stomach/Bowel: Proximal gastric underdistention. Extensive colonic diverticulosis. Normal terminal ileum. Normal small bowel. Vascular/Lymphatic: Advanced aortic and branch vessel atherosclerosis. Again identified are abdominal retroperitoneal collateral veins, similar to 2019. Left femoral vein hypoattenuation, consistent with deep venous thrombosis. Example 74/2. No abdominopelvic adenopathy. Reproductive: Central uterine hypoattenuation on 57/2. No adnexal mass. Other: No significant free fluid.  No free intraperitoneal air. Musculoskeletal: Right chest wall/inferior breast 6 mm nodule on 04/02, nonspecific. Osteopenia. Right pubic rami comminuted, nonacute fractures. New since the prior. Lumbar spondylosis. IMPRESSION: 1. Multifactorial degradation, as detailed above. 2. Left femoral vein deep venous thrombosis,  incompletely imaged. Consider further evaluation with dedicated left lower extremity ultrasound. 3. Small bilateral pleural effusions. Bibasilar airspace disease is favored to represent atelectasis. Concurrent mild infection or aspiration not excluded. 4. Uterine fundal endometrial hypoattenuation. Correlate with postmenopausal bleeding. 5. Apparent bladder wall thickening and mild mucosal hyperenhancement, likely related to cystitis. 6. Nonacute right pubic rami fractures, as on 07/09/2020. Electronically Signed   By: Abigail Miyamoto M.D.   On: 08/20/2020 18:12   DG Chest Portable 1 View  Result Date: 08/20/2020 CLINICAL DATA:  86 year old female with bilateral lower extremity swelling. EXAM: PORTABLE CHEST 1 VIEW COMPARISON:  Chest radiograph dated 06/14/2012. FINDINGS: There are bibasilar atelectasis. Pneumonia is less likely. Clinical correlation is recommended. No focal consolidation, pleural  effusion or pneumothorax. Mild chronic bronchitic changes. Top-normal cardiac size. Atherosclerotic calcification of the aorta. No acute osseous pathology. IMPRESSION: No active disease.  Probable bibasilar atelectasis. Electronically Signed   By: Anner Crete M.D.   On: 08/20/2020 16:03    Procedures Procedures (including critical care time)  Medications Ordered in ED Medications  apixaban (ELIQUIS) tablet 10 mg (10 mg Oral Given 08/20/20 2040)    Followed by  apixaban (ELIQUIS) tablet 5 mg (has no administration in time range)  cefTRIAXone (ROCEPHIN) 1 g in sodium chloride 0.9 % 100 mL IVPB (0 g Intravenous Stopped 08/20/20 2040)  iohexol (OMNIPAQUE) 300 MG/ML solution 100 mL (100 mLs Intravenous Contrast Given 08/20/20 1737)    ED Course  I have reviewed the triage vital signs and the nursing notes.  Pertinent labs & imaging results that were available during my care of the patient were reviewed by me and considered in my medical decision making (see chart for details).    MDM  Rules/Calculators/A&P                          86yo female with history of dementia, hypertension presents from [facility] in [place] with upper and lower extremity edema, concern for possible UTI and possible wound infection.  Ms. Schwaiger also described difficulty walking.  Given concern for altered mental status, initially little history, ordered CT for evaluation for ICH and showed no acute abnormalities. Chest x-ray without signs of fluid overload, no pneumonia. She reported abdominal pain on history and exam and CT abdomen was done which showed no evidence of acute abnormalities. Does show left femoral DVT. Unable to obtain DVT ultrasound at this time, do not feel that it will change her acute care. She denies chest pain or shortness of breath, her vital signs are otherwise stable. CT also showed slight uterine abnormality, recommended follow-up as an outpatient. CT also showed findings consistent with urinary tract infection. Urinalysis is consistent with urinary tract infection, and this may be etiology of her symptoms of lower abdominal pain and generalized weakness. She is given Rocephin in the emergency department. Discussed risks and benefits of Eliquis. She was given a prescription for Eliquis for DVT and recommend follow-up with her primary care doctor for lower extremity swelling, DVT, and UTI.    Final Clinical Impression(s) / ED Diagnoses Final diagnoses:  Peripheral edema  Acute deep vein thrombosis (DVT) of femoral vein of left lower extremity (HCC)  Acute cystitis without hematuria    Rx / DC Orders ED Discharge Orders         Ordered    APIXABAN (ELIQUIS) VTE STARTER PACK (10MG  AND 5MG )        08/20/20 2045    cephALEXin (KEFLEX) 500 MG capsule  3 times daily        08/20/20 2045           Gareth Morgan, MD 08/21/20 0200

## 2020-08-20 NOTE — ED Notes (Signed)
[facility] called and notified of patient being discharged.

## 2020-08-20 NOTE — ED Triage Notes (Addendum)
Per EMS-patient from [facility] in [place] and lower extremity with pitting edema-clear fluid weeping in lower extremities-staff also states blood in catheter, odor-patient is no longer taking fluid pill since being lmoved to current facility

## 2020-08-21 LAB — URINE CULTURE

## 2020-08-22 DIAGNOSIS — W01198A Fall on same level from slipping, tripping and stumbling with subsequent striking against other object, initial encounter: Secondary | ICD-10-CM | POA: Diagnosis not present

## 2020-08-22 DIAGNOSIS — G319 Degenerative disease of nervous system, unspecified: Secondary | ICD-10-CM | POA: Diagnosis not present

## 2020-08-22 DIAGNOSIS — M47812 Spondylosis without myelopathy or radiculopathy, cervical region: Secondary | ICD-10-CM | POA: Diagnosis not present

## 2020-08-22 DIAGNOSIS — I6782 Cerebral ischemia: Secondary | ICD-10-CM | POA: Diagnosis not present

## 2020-08-22 DIAGNOSIS — R609 Edema, unspecified: Secondary | ICD-10-CM | POA: Diagnosis not present

## 2020-08-22 DIAGNOSIS — S0990XA Unspecified injury of head, initial encounter: Secondary | ICD-10-CM | POA: Diagnosis not present

## 2020-08-22 DIAGNOSIS — Z23 Encounter for immunization: Secondary | ICD-10-CM | POA: Diagnosis not present

## 2020-08-22 DIAGNOSIS — G9389 Other specified disorders of brain: Secondary | ICD-10-CM | POA: Diagnosis not present

## 2020-08-22 DIAGNOSIS — R5381 Other malaise: Secondary | ICD-10-CM | POA: Diagnosis not present

## 2020-08-22 DIAGNOSIS — I82402 Acute embolism and thrombosis of unspecified deep veins of left lower extremity: Secondary | ICD-10-CM | POA: Diagnosis not present

## 2020-08-22 DIAGNOSIS — W19XXXA Unspecified fall, initial encounter: Secondary | ICD-10-CM | POA: Diagnosis not present

## 2020-08-22 DIAGNOSIS — Y998 Other external cause status: Secondary | ICD-10-CM | POA: Diagnosis not present

## 2020-08-22 DIAGNOSIS — Z7401 Bed confinement status: Secondary | ICD-10-CM | POA: Diagnosis not present

## 2020-08-22 DIAGNOSIS — N3090 Cystitis, unspecified without hematuria: Secondary | ICD-10-CM | POA: Diagnosis not present

## 2020-08-22 DIAGNOSIS — S81812A Laceration without foreign body, left lower leg, initial encounter: Secondary | ICD-10-CM | POA: Diagnosis not present

## 2020-08-22 DIAGNOSIS — R52 Pain, unspecified: Secondary | ICD-10-CM | POA: Diagnosis not present

## 2020-08-22 DIAGNOSIS — S199XXA Unspecified injury of neck, initial encounter: Secondary | ICD-10-CM | POA: Diagnosis not present

## 2020-08-22 DIAGNOSIS — M2550 Pain in unspecified joint: Secondary | ICD-10-CM | POA: Diagnosis not present

## 2020-08-22 DIAGNOSIS — Y92129 Unspecified place in nursing home as the place of occurrence of the external cause: Secondary | ICD-10-CM | POA: Diagnosis not present

## 2020-08-23 DIAGNOSIS — M1990 Unspecified osteoarthritis, unspecified site: Secondary | ICD-10-CM | POA: Diagnosis not present

## 2020-08-23 DIAGNOSIS — D235 Other benign neoplasm of skin of trunk: Secondary | ICD-10-CM | POA: Diagnosis not present

## 2020-08-23 DIAGNOSIS — N390 Urinary tract infection, site not specified: Secondary | ICD-10-CM | POA: Diagnosis not present

## 2020-08-23 DIAGNOSIS — M6281 Muscle weakness (generalized): Secondary | ICD-10-CM | POA: Diagnosis not present

## 2020-08-23 DIAGNOSIS — I10 Essential (primary) hypertension: Secondary | ICD-10-CM | POA: Diagnosis not present

## 2020-08-23 DIAGNOSIS — G4700 Insomnia, unspecified: Secondary | ICD-10-CM | POA: Diagnosis not present

## 2020-08-23 DIAGNOSIS — S32511D Fracture of superior rim of right pubis, subsequent encounter for fracture with routine healing: Secondary | ICD-10-CM | POA: Diagnosis not present

## 2020-08-23 DIAGNOSIS — M160 Bilateral primary osteoarthritis of hip: Secondary | ICD-10-CM | POA: Diagnosis not present

## 2020-08-23 DIAGNOSIS — R609 Edema, unspecified: Secondary | ICD-10-CM | POA: Diagnosis not present

## 2020-08-23 DIAGNOSIS — R296 Repeated falls: Secondary | ICD-10-CM | POA: Diagnosis not present

## 2020-08-23 DIAGNOSIS — G8929 Other chronic pain: Secondary | ICD-10-CM | POA: Diagnosis not present

## 2020-08-23 DIAGNOSIS — M1711 Unilateral primary osteoarthritis, right knee: Secondary | ICD-10-CM | POA: Diagnosis not present

## 2020-08-24 DIAGNOSIS — M6281 Muscle weakness (generalized): Secondary | ICD-10-CM | POA: Diagnosis not present

## 2020-08-24 DIAGNOSIS — I10 Essential (primary) hypertension: Secondary | ICD-10-CM | POA: Diagnosis not present

## 2020-08-24 DIAGNOSIS — S32511D Fracture of superior rim of right pubis, subsequent encounter for fracture with routine healing: Secondary | ICD-10-CM | POA: Diagnosis not present

## 2020-08-24 DIAGNOSIS — M160 Bilateral primary osteoarthritis of hip: Secondary | ICD-10-CM | POA: Diagnosis not present

## 2020-08-24 DIAGNOSIS — M1711 Unilateral primary osteoarthritis, right knee: Secondary | ICD-10-CM | POA: Diagnosis not present

## 2020-08-24 DIAGNOSIS — R296 Repeated falls: Secondary | ICD-10-CM | POA: Diagnosis not present

## 2020-08-25 DIAGNOSIS — R296 Repeated falls: Secondary | ICD-10-CM | POA: Diagnosis not present

## 2020-08-25 DIAGNOSIS — S32511D Fracture of superior rim of right pubis, subsequent encounter for fracture with routine healing: Secondary | ICD-10-CM | POA: Diagnosis not present

## 2020-08-25 DIAGNOSIS — M160 Bilateral primary osteoarthritis of hip: Secondary | ICD-10-CM | POA: Diagnosis not present

## 2020-08-25 DIAGNOSIS — I10 Essential (primary) hypertension: Secondary | ICD-10-CM | POA: Diagnosis not present

## 2020-08-25 DIAGNOSIS — M6281 Muscle weakness (generalized): Secondary | ICD-10-CM | POA: Diagnosis not present

## 2020-08-25 DIAGNOSIS — M1711 Unilateral primary osteoarthritis, right knee: Secondary | ICD-10-CM | POA: Diagnosis not present

## 2020-08-29 DIAGNOSIS — M1711 Unilateral primary osteoarthritis, right knee: Secondary | ICD-10-CM | POA: Diagnosis not present

## 2020-08-29 DIAGNOSIS — I10 Essential (primary) hypertension: Secondary | ICD-10-CM | POA: Diagnosis not present

## 2020-08-29 DIAGNOSIS — M6281 Muscle weakness (generalized): Secondary | ICD-10-CM | POA: Diagnosis not present

## 2020-08-29 DIAGNOSIS — R296 Repeated falls: Secondary | ICD-10-CM | POA: Diagnosis not present

## 2020-08-29 DIAGNOSIS — S32511D Fracture of superior rim of right pubis, subsequent encounter for fracture with routine healing: Secondary | ICD-10-CM | POA: Diagnosis not present

## 2020-08-29 DIAGNOSIS — M160 Bilateral primary osteoarthritis of hip: Secondary | ICD-10-CM | POA: Diagnosis not present

## 2020-08-30 ENCOUNTER — Encounter (HOSPITAL_COMMUNITY): Payer: Self-pay

## 2020-08-30 ENCOUNTER — Emergency Department (HOSPITAL_COMMUNITY)
Admission: EM | Admit: 2020-08-30 | Discharge: 2020-08-30 | Disposition: A | Discharge status: Home / Self Care | Payer: Medicare Other | Attending: Emergency Medicine | Admitting: Emergency Medicine

## 2020-08-30 ENCOUNTER — Emergency Department (HOSPITAL_COMMUNITY): Payer: Medicare Other

## 2020-08-30 DIAGNOSIS — I959 Hypotension, unspecified: Secondary | ICD-10-CM | POA: Diagnosis not present

## 2020-08-30 DIAGNOSIS — S91301A Unspecified open wound, right foot, initial encounter: Secondary | ICD-10-CM | POA: Diagnosis not present

## 2020-08-30 DIAGNOSIS — J90 Pleural effusion, not elsewhere classified: Secondary | ICD-10-CM | POA: Diagnosis not present

## 2020-08-30 DIAGNOSIS — W19XXXA Unspecified fall, initial encounter: Secondary | ICD-10-CM | POA: Insufficient documentation

## 2020-08-30 DIAGNOSIS — F0390 Unspecified dementia without behavioral disturbance: Secondary | ICD-10-CM | POA: Diagnosis not present

## 2020-08-30 DIAGNOSIS — I10 Essential (primary) hypertension: Secondary | ICD-10-CM | POA: Diagnosis not present

## 2020-08-30 DIAGNOSIS — S199XXA Unspecified injury of neck, initial encounter: Secondary | ICD-10-CM | POA: Diagnosis not present

## 2020-08-30 DIAGNOSIS — S81812A Laceration without foreign body, left lower leg, initial encounter: Secondary | ICD-10-CM | POA: Diagnosis not present

## 2020-08-30 DIAGNOSIS — R600 Localized edema: Secondary | ICD-10-CM | POA: Diagnosis not present

## 2020-08-30 DIAGNOSIS — Z043 Encounter for examination and observation following other accident: Secondary | ICD-10-CM | POA: Diagnosis not present

## 2020-08-30 DIAGNOSIS — Y92129 Unspecified place in nursing home as the place of occurrence of the external cause: Secondary | ICD-10-CM | POA: Diagnosis not present

## 2020-08-30 DIAGNOSIS — M2011 Hallux valgus (acquired), right foot: Secondary | ICD-10-CM | POA: Diagnosis not present

## 2020-08-30 DIAGNOSIS — Z743 Need for continuous supervision: Secondary | ICD-10-CM | POA: Diagnosis not present

## 2020-08-30 DIAGNOSIS — S0083XA Contusion of other part of head, initial encounter: Secondary | ICD-10-CM | POA: Diagnosis present

## 2020-08-30 DIAGNOSIS — R279 Unspecified lack of coordination: Secondary | ICD-10-CM | POA: Diagnosis not present

## 2020-08-30 DIAGNOSIS — R402411 Glasgow coma scale score 13-15, in the field [EMT or ambulance]: Secondary | ICD-10-CM | POA: Diagnosis not present

## 2020-08-30 DIAGNOSIS — L8995 Pressure ulcer of unspecified site, unstageable: Secondary | ICD-10-CM | POA: Diagnosis not present

## 2020-08-30 DIAGNOSIS — S0990XA Unspecified injury of head, initial encounter: Secondary | ICD-10-CM | POA: Insufficient documentation

## 2020-08-30 HISTORY — DX: Unspecified dementia, unspecified severity, without behavioral disturbance, psychotic disturbance, mood disturbance, and anxiety: F03.90

## 2020-08-30 NOTE — ED Provider Notes (Signed)
Emergency Department Provider Note   I have reviewed the triage vital signs and the nursing notes.   HISTORY  Chief Complaint Fall   HPI Kimberly English is a 87 y.o. female with PMH of known pelvic fracture with frequent falls, HTN, currently being treated for UTI, and known DVT on anticoagulation presents to the emergency department for evaluation after unwitnessed fall at her nursing facility.  She apparently was in bed but found on the floor short time after nursing staff just seen her.  She has some bruising to the forehead and EMS was called for evaluation and transported to the emergency department.  The patient has dementia and is unable to account for how she fell.  She is able to tell me her name.  Level 5 caveat applies with dementia.    EMS deny any abnormal vital signs in route or mental status changes.    Past Medical History:  Diagnosis Date  . Dementia (HCC)   . Hypertension     Patient Active Problem List   Diagnosis Date Noted  . Pelvic fracture (HCC) 07/09/2020  . Hyponatremia 06/14/2012  . Hypokalemia 06/14/2012  . Confusion 06/14/2012  . Generalized weakness 06/14/2012  . Nausea 06/14/2012  . HTN (hypertension) 06/14/2012    Past Surgical History:  Procedure Laterality Date  . EYE SURGERY      Allergies Codeine  No family history on file.  Social History Social History   Tobacco Use  . Smoking status: Never Smoker  . Smokeless tobacco: Never Used  Vaping Use  . Vaping Use: Never used  Substance Use Topics  . Alcohol use: No  . Drug use: No    Review of Systems  Level 5 caveat: Dementia   ____________________________________________   PHYSICAL EXAM:  VITAL SIGNS: Vitals:   08/30/20 1145 08/30/20 1215  BP: (!) 126/56 138/61  Pulse: 69 73  Resp: 15 12  Temp:    SpO2: 99% 99%     Constitutional: Alert and following commands briskly but confused. Well appearing and in no acute distress. Eyes: Conjunctivae are  normal.  Head: hematoma of the forehead without laceration.  Nose: No congestion/rhinnorhea. Mouth/Throat: Mucous membranes are moist.   Neck: No stridor.  Cardiovascular: Normal rate, regular rhythm. Good peripheral circulation. Grossly normal heart sounds.   Respiratory: Normal respiratory effort.  No retractions. Lungs CTAB. Gastrointestinal: Soft and nontender. No distention.  Musculoskeletal: B/L LE edema (1+).  Normal passive range of motion of the bilateral hips and knees.  No bony tenderness.  Normal range of motion of the bilateral upper extremities.  Neurologic:  Normal speech and language. No gross focal neurologic deficits are appreciated.  Skin:  Skin is warm and dry.  Developing ulceration to the back of the right heel (5cm x 3cm) without visible bone or invasion into the fatty tissue. Left lower leg skin tear over the lateral tib-fib region measuring 6cm x 2cm.   ____________________________________________  RADIOLOGY  DG Chest 2 View  Result Date: 08/30/2020 CLINICAL DATA:  Fall. EXAM: CHEST - 2 VIEW COMPARISON:  08/20/2020 FINDINGS: Mislabeled laterality based on prior. Normal heart size and stable aortic tortuosity. Mildly coarsened lung markings, stable or improved from before. Small pleural effusions seen on the lateral view. There is no edema, consolidation, or pneumothorax. IMPRESSION: 1. Trace bilateral pleural effusion, also seen on prior. 2. No acute finding Electronically Signed   By: Marnee SpringJonathon  Watts M.D.   On: 08/30/2020 11:22   DG Pelvis 1-2 Views  Result Date: 08/30/2020 CLINICAL DATA:  Fall. EXAM: PELVIS - 1-2 VIEW COMPARISON:  August 20, 2020. FINDINGS: Old fractures involving the right superior and inferior pubic rami are again noted. No acute fracture is noted. Mild osteophyte formation is seen involving the left hip joint. Sacroiliac joints are unremarkable. IMPRESSION: Old right superior and inferior pubic rami fractures. No acute abnormality seen in the  pelvis. Electronically Signed   By: Lupita Raider M.D.   On: 08/30/2020 11:24   DG Tibia/Fibula Left  Result Date: 08/30/2020 CLINICAL DATA:  Status post fall. EXAM: LEFT TIBIA AND FIBULA - 2 VIEW COMPARISON:  None. FINDINGS: There is no evidence of fracture or other focal bone lesions. Soft tissues are unremarkable. IMPRESSION: Negative. Electronically Signed   By: Lupita Raider M.D.   On: 08/30/2020 11:21   CT Head Wo Contrast  Result Date: 08/30/2020 CLINICAL DATA:  Neck trauma from fall. EXAM: CT HEAD WITHOUT CONTRAST CT CERVICAL SPINE WITHOUT CONTRAST TECHNIQUE: Multidetector CT imaging of the head and cervical spine was performed following the standard protocol without intravenous contrast. Multiplanar CT image reconstructions of the cervical spine were also generated. COMPARISON:  08/22/20 FINDINGS: CT HEAD FINDINGS Brain: No evidence of acute infarction, hemorrhage, hydrocephalus, extra-axial collection or mass lesion/mass effect. Probable small remote right inferior cerebellar infarct but in an area affected by streak artifact. Cerebral volume loss in keeping with aging. Vascular: No hyperdense vessel or unexpected calcification. Skull: Normal. Negative for fracture or focal lesion. The vertex is excluded from view. Sinuses/Orbits: Bilateral cataract resection.  No evidence of injury CT CERVICAL SPINE FINDINGS Alignment: No traumatic malalignment Skull base and vertebrae: No acute fracture. Soft tissues and spinal canal: No prevertebral fluid or swelling. No visible canal hematoma. Disc levels: Multilevel disc and facet degeneration with C2-3 facet ankylosis. Upper chest: No visible injured IMPRESSION: No evidence of acute intracranial or cervical spine injury. Electronically Signed   By: Marnee Spring M.D.   On: 08/30/2020 12:06   CT Cervical Spine Wo Contrast  Result Date: 08/30/2020 CLINICAL DATA:  Neck trauma from fall. EXAM: CT HEAD WITHOUT CONTRAST CT CERVICAL SPINE WITHOUT CONTRAST  TECHNIQUE: Multidetector CT imaging of the head and cervical spine was performed following the standard protocol without intravenous contrast. Multiplanar CT image reconstructions of the cervical spine were also generated. COMPARISON:  08/22/20 FINDINGS: CT HEAD FINDINGS Brain: No evidence of acute infarction, hemorrhage, hydrocephalus, extra-axial collection or mass lesion/mass effect. Probable small remote right inferior cerebellar infarct but in an area affected by streak artifact. Cerebral volume loss in keeping with aging. Vascular: No hyperdense vessel or unexpected calcification. Skull: Normal. Negative for fracture or focal lesion. The vertex is excluded from view. Sinuses/Orbits: Bilateral cataract resection.  No evidence of injury CT CERVICAL SPINE FINDINGS Alignment: No traumatic malalignment Skull base and vertebrae: No acute fracture. Soft tissues and spinal canal: No prevertebral fluid or swelling. No visible canal hematoma. Disc levels: Multilevel disc and facet degeneration with C2-3 facet ankylosis. Upper chest: No visible injured IMPRESSION: No evidence of acute intracranial or cervical spine injury. Electronically Signed   By: Marnee Spring M.D.   On: 08/30/2020 12:06   DG Foot Complete Right  Result Date: 08/30/2020 CLINICAL DATA:  Fall. EXAM: RIGHT FOOT COMPLETE - 3+ VIEW COMPARISON:  None. FINDINGS: There is no evidence of fracture or dislocation. Moderate hallux valgus deformity of the first metatarsophalangeal joint is noted. Soft tissues are unremarkable. IMPRESSION: Moderate hallux valgus deformity of the first metatarsophalangeal joint. No  acute abnormality seen in the right foot. Electronically Signed   By: Marijo Conception M.D.   On: 08/30/2020 11:26    ____________________________________________   PROCEDURES  Procedure(s) performed:   Procedures  None  ____________________________________________   INITIAL IMPRESSION / ASSESSMENT AND PLAN / ED COURSE  Pertinent  labs & imaging results that were available during my care of the patient were reviewed by me and considered in my medical decision making (see chart for details).   Patient presents emergency department for evaluation after unwitnessed fall on anticoagulation with known DVT.  She is confused but alert and following commands.  She is able to tell me her name.  Plan for CT imaging of the head and cervical spine along with plain films of the chest and pelvis.  She does have known pubic rami fractures the setting of frequent falls.  Normal passive range of motion of the bilateral hips.  Some superficial skin breakdown over the left leg and right heel which appear more chronic.  Neither area appears acute or consistent with laceration that would require suture.  Plan for plain films of these areas given spotty history at this time. Afebrile here. Plan to hold on labs with patient at her mental status baseline and history of frequent falls.   12:50 PM  Patient's imaging reviewed showing no acute process or finding.  Removed cervical collar.  Reassessed patient's mental status which is unchanged from her arrival.  Plan for nonstick dressings to her leg wounds with PCP follow-up and possible referral to wound management per their discretion.  Wounds at this time are very shallow and will likely respond to wound care.  Provided detailed information regarding her ED work-up here on the AVS for nursing home staff and will prepare the patient for discharge and transport back to her facility. ____________________________________________  FINAL CLINICAL IMPRESSION(S) / ED DIAGNOSES  Final diagnoses:  Fall, initial encounter  Injury of head, initial encounter  Open wound of right heel, initial encounter  Noninfected skin tear of left lower extremity, initial encounter    Note:  This document was prepared using Dragon voice recognition software and may include unintentional dictation errors.  Nanda Quinton, MD,  [facility] Emergency Medicine    Kristyna Bradstreet, [facility], MD 08/30/20 1259

## 2020-08-30 NOTE — ED Notes (Signed)
Dr Long at bedside

## 2020-08-30 NOTE — ED Notes (Signed)
[facility] called for transport.  

## 2020-08-30 NOTE — ED Notes (Signed)
Patient transported to X-ray 

## 2020-08-30 NOTE — Discharge Instructions (Signed)
You were seen in the ED today after a fall. Your CT scans of the head and neck were normal. X-rays of your foot and left leg were also normal. Please continue your home medications and follow up with your PCP in the coming week. Apply non-stick dressings to the wounds.

## 2020-08-30 NOTE — ED Triage Notes (Signed)
Per [facility]: unwitnessed fall from bed onto floor. Arrives in c-collar from EMS. Small hematoma to forehead 2-3 inch skin tear to left lower leg. Circular skin tear to right heel. Pt had a pressure ulcer that staff thinks opened up. Pts legs have been swollen and leaking fluid over the last few weeks, has decreased with elevation. May be on treatment for UTI, has a foley catheter. Pt is confused at baseline, pr normally alert to only self. Per chart has dementia.

## 2020-09-01 DIAGNOSIS — G8929 Other chronic pain: Secondary | ICD-10-CM | POA: Diagnosis not present

## 2020-09-01 DIAGNOSIS — I10 Essential (primary) hypertension: Secondary | ICD-10-CM | POA: Diagnosis not present

## 2020-09-01 DIAGNOSIS — M160 Bilateral primary osteoarthritis of hip: Secondary | ICD-10-CM | POA: Diagnosis not present

## 2020-09-01 DIAGNOSIS — S32511D Fracture of superior rim of right pubis, subsequent encounter for fracture with routine healing: Secondary | ICD-10-CM | POA: Diagnosis not present

## 2020-09-01 DIAGNOSIS — M1711 Unilateral primary osteoarthritis, right knee: Secondary | ICD-10-CM | POA: Diagnosis not present

## 2020-09-01 DIAGNOSIS — R296 Repeated falls: Secondary | ICD-10-CM | POA: Diagnosis not present

## 2020-09-01 DIAGNOSIS — M6281 Muscle weakness (generalized): Secondary | ICD-10-CM | POA: Diagnosis not present

## 2020-09-01 DIAGNOSIS — R609 Edema, unspecified: Secondary | ICD-10-CM | POA: Diagnosis not present

## 2020-09-02 DIAGNOSIS — M6281 Muscle weakness (generalized): Secondary | ICD-10-CM | POA: Diagnosis not present

## 2020-09-02 DIAGNOSIS — I10 Essential (primary) hypertension: Secondary | ICD-10-CM | POA: Diagnosis not present

## 2020-09-02 DIAGNOSIS — M1711 Unilateral primary osteoarthritis, right knee: Secondary | ICD-10-CM | POA: Diagnosis not present

## 2020-09-02 DIAGNOSIS — R296 Repeated falls: Secondary | ICD-10-CM | POA: Diagnosis not present

## 2020-09-02 DIAGNOSIS — S32511D Fracture of superior rim of right pubis, subsequent encounter for fracture with routine healing: Secondary | ICD-10-CM | POA: Diagnosis not present

## 2020-09-02 DIAGNOSIS — M160 Bilateral primary osteoarthritis of hip: Secondary | ICD-10-CM | POA: Diagnosis not present

## 2020-09-03 DIAGNOSIS — I10 Essential (primary) hypertension: Secondary | ICD-10-CM | POA: Diagnosis present

## 2020-09-03 DIAGNOSIS — L89226 Pressure-induced deep tissue damage of left hip: Secondary | ICD-10-CM | POA: Diagnosis present

## 2020-09-03 DIAGNOSIS — R4182 Altered mental status, unspecified: Secondary | ICD-10-CM | POA: Diagnosis not present

## 2020-09-03 DIAGNOSIS — A419 Sepsis, unspecified organism: Secondary | ICD-10-CM | POA: Diagnosis not present

## 2020-09-03 DIAGNOSIS — L89152 Pressure ulcer of sacral region, stage 2: Secondary | ICD-10-CM | POA: Diagnosis present

## 2020-09-03 DIAGNOSIS — I517 Cardiomegaly: Secondary | ICD-10-CM | POA: Diagnosis not present

## 2020-09-03 DIAGNOSIS — G9341 Metabolic encephalopathy: Secondary | ICD-10-CM | POA: Diagnosis present

## 2020-09-03 DIAGNOSIS — B965 Pseudomonas (aeruginosa) (mallei) (pseudomallei) as the cause of diseases classified elsewhere: Secondary | ICD-10-CM | POA: Diagnosis present

## 2020-09-03 DIAGNOSIS — F419 Anxiety disorder, unspecified: Secondary | ICD-10-CM | POA: Diagnosis present

## 2020-09-03 DIAGNOSIS — Z86718 Personal history of other venous thrombosis and embolism: Secondary | ICD-10-CM | POA: Diagnosis not present

## 2020-09-03 DIAGNOSIS — D72810 Lymphocytopenia: Secondary | ICD-10-CM | POA: Diagnosis present

## 2020-09-03 DIAGNOSIS — L89610 Pressure ulcer of right heel, unstageable: Secondary | ICD-10-CM | POA: Diagnosis present

## 2020-09-03 DIAGNOSIS — R6520 Severe sepsis without septic shock: Secondary | ICD-10-CM | POA: Diagnosis present

## 2020-09-03 DIAGNOSIS — I248 Other forms of acute ischemic heart disease: Secondary | ICD-10-CM | POA: Diagnosis not present

## 2020-09-03 DIAGNOSIS — Z515 Encounter for palliative care: Secondary | ICD-10-CM | POA: Diagnosis not present

## 2020-09-03 DIAGNOSIS — E162 Hypoglycemia, unspecified: Secondary | ICD-10-CM | POA: Diagnosis not present

## 2020-09-03 DIAGNOSIS — U071 COVID-19: Secondary | ICD-10-CM | POA: Diagnosis present

## 2020-09-03 DIAGNOSIS — E860 Dehydration: Secondary | ICD-10-CM | POA: Diagnosis present

## 2020-09-03 DIAGNOSIS — L89622 Pressure ulcer of left heel, stage 2: Secondary | ICD-10-CM | POA: Diagnosis present

## 2020-09-03 DIAGNOSIS — A4189 Other specified sepsis: Secondary | ICD-10-CM | POA: Diagnosis not present

## 2020-09-03 DIAGNOSIS — M2550 Pain in unspecified joint: Secondary | ICD-10-CM | POA: Diagnosis not present

## 2020-09-03 DIAGNOSIS — I82412 Acute embolism and thrombosis of left femoral vein: Secondary | ICD-10-CM | POA: Diagnosis present

## 2020-09-03 DIAGNOSIS — Z66 Do not resuscitate: Secondary | ICD-10-CM | POA: Diagnosis not present

## 2020-09-03 DIAGNOSIS — I959 Hypotension, unspecified: Secondary | ICD-10-CM | POA: Diagnosis not present

## 2020-09-03 DIAGNOSIS — R5381 Other malaise: Secondary | ICD-10-CM | POA: Diagnosis not present

## 2020-09-03 DIAGNOSIS — R627 Adult failure to thrive: Secondary | ICD-10-CM | POA: Diagnosis present

## 2020-09-03 DIAGNOSIS — L89326 Pressure-induced deep tissue damage of left buttock: Secondary | ICD-10-CM | POA: Diagnosis present

## 2020-09-03 DIAGNOSIS — F0390 Unspecified dementia without behavioral disturbance: Secondary | ICD-10-CM | POA: Diagnosis present

## 2020-09-03 DIAGNOSIS — R1310 Dysphagia, unspecified: Secondary | ICD-10-CM | POA: Diagnosis not present

## 2020-09-03 DIAGNOSIS — N390 Urinary tract infection, site not specified: Secondary | ICD-10-CM | POA: Diagnosis present

## 2020-09-03 DIAGNOSIS — R296 Repeated falls: Secondary | ICD-10-CM | POA: Diagnosis present

## 2020-09-03 DIAGNOSIS — K5900 Constipation, unspecified: Secondary | ICD-10-CM | POA: Diagnosis not present

## 2020-09-03 DIAGNOSIS — E861 Hypovolemia: Secondary | ICD-10-CM | POA: Diagnosis present

## 2020-09-03 DIAGNOSIS — I6782 Cerebral ischemia: Secondary | ICD-10-CM | POA: Diagnosis present

## 2020-09-03 DIAGNOSIS — Z7401 Bed confinement status: Secondary | ICD-10-CM | POA: Diagnosis not present

## 2020-09-03 DIAGNOSIS — E46 Unspecified protein-calorie malnutrition: Secondary | ICD-10-CM | POA: Diagnosis not present

## 2020-09-03 DIAGNOSIS — E43 Unspecified severe protein-calorie malnutrition: Secondary | ICD-10-CM | POA: Diagnosis not present

## 2020-09-08 DIAGNOSIS — Z87440 Personal history of urinary (tract) infections: Secondary | ICD-10-CM | POA: Diagnosis not present

## 2020-09-08 DIAGNOSIS — R531 Weakness: Secondary | ICD-10-CM | POA: Diagnosis not present

## 2020-09-08 DIAGNOSIS — F0150 Vascular dementia without behavioral disturbance: Secondary | ICD-10-CM | POA: Diagnosis not present

## 2020-09-08 DIAGNOSIS — Z9181 History of falling: Secondary | ICD-10-CM | POA: Diagnosis not present

## 2020-09-08 DIAGNOSIS — R296 Repeated falls: Secondary | ICD-10-CM | POA: Diagnosis not present

## 2020-09-08 DIAGNOSIS — G311 Senile degeneration of brain, not elsewhere classified: Secondary | ICD-10-CM | POA: Diagnosis not present

## 2020-09-08 DIAGNOSIS — E43 Unspecified severe protein-calorie malnutrition: Secondary | ICD-10-CM | POA: Diagnosis not present

## 2020-09-08 DIAGNOSIS — R1312 Dysphagia, oropharyngeal phase: Secondary | ICD-10-CM | POA: Diagnosis not present

## 2020-09-08 DIAGNOSIS — I6782 Cerebral ischemia: Secondary | ICD-10-CM | POA: Diagnosis not present

## 2020-09-08 DIAGNOSIS — U071 COVID-19: Secondary | ICD-10-CM | POA: Diagnosis not present

## 2020-09-08 DIAGNOSIS — I82412 Acute embolism and thrombosis of left femoral vein: Secondary | ICD-10-CM | POA: Diagnosis not present

## 2020-09-09 DIAGNOSIS — E161 Other hypoglycemia: Secondary | ICD-10-CM | POA: Diagnosis not present

## 2020-09-09 DIAGNOSIS — R000 Tachycardia, unspecified: Secondary | ICD-10-CM | POA: Diagnosis not present

## 2020-09-09 DIAGNOSIS — E162 Hypoglycemia, unspecified: Secondary | ICD-10-CM | POA: Diagnosis not present

## 2020-09-09 DIAGNOSIS — R404 Transient alteration of awareness: Secondary | ICD-10-CM | POA: Diagnosis not present

## 2020-09-27 DIAGNOSIS — 419620001 Death: Secondary | SNOMED CT | POA: Diagnosis not present

## 2020-09-27 DEATH — deceased

## 2022-08-22 IMAGING — CR DG KNEE COMPLETE 4+V*R*
4 series · 4 of 4 positions shown · non-contrast
Comparison: None.

CLINICAL DATA: Fell, pain

EXAM:
RIGHT KNEE - COMPLETE 4+ VIEW

[x knee ap right (1 of 3)]
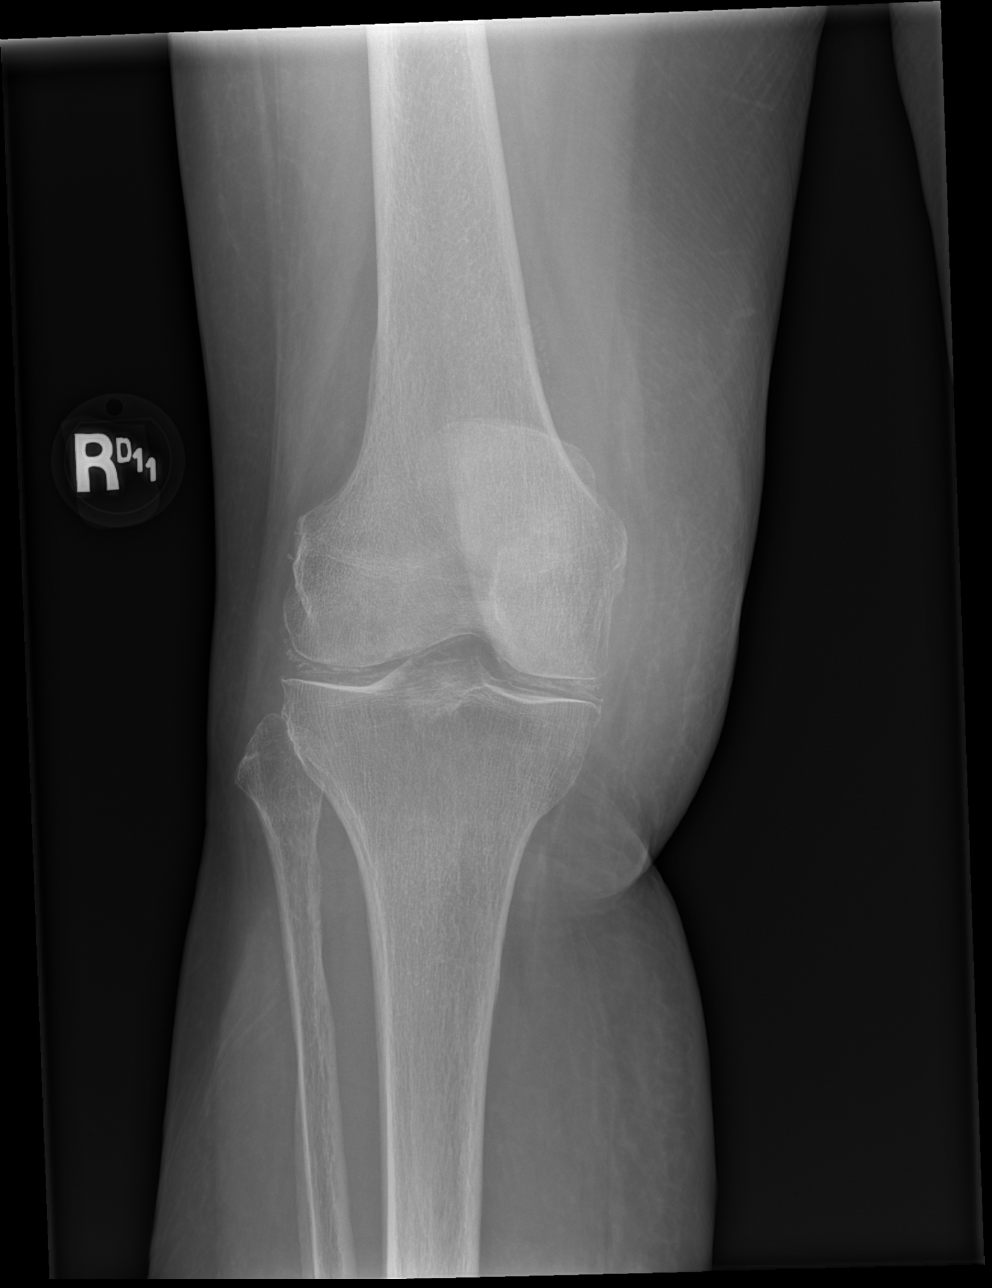

[x knee ap right (2 of 3)]
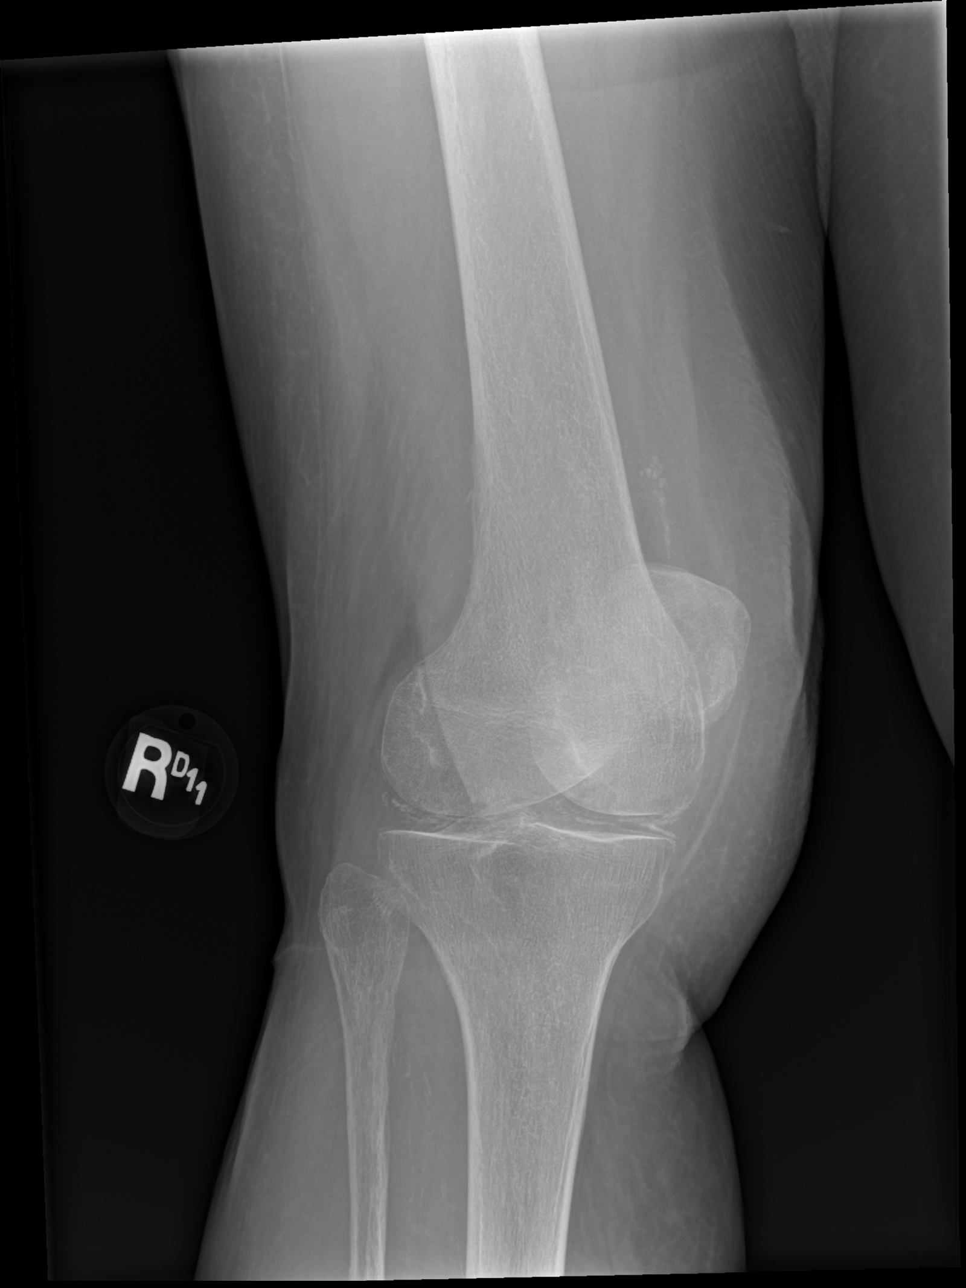

[x knee ap right (3 of 3)]
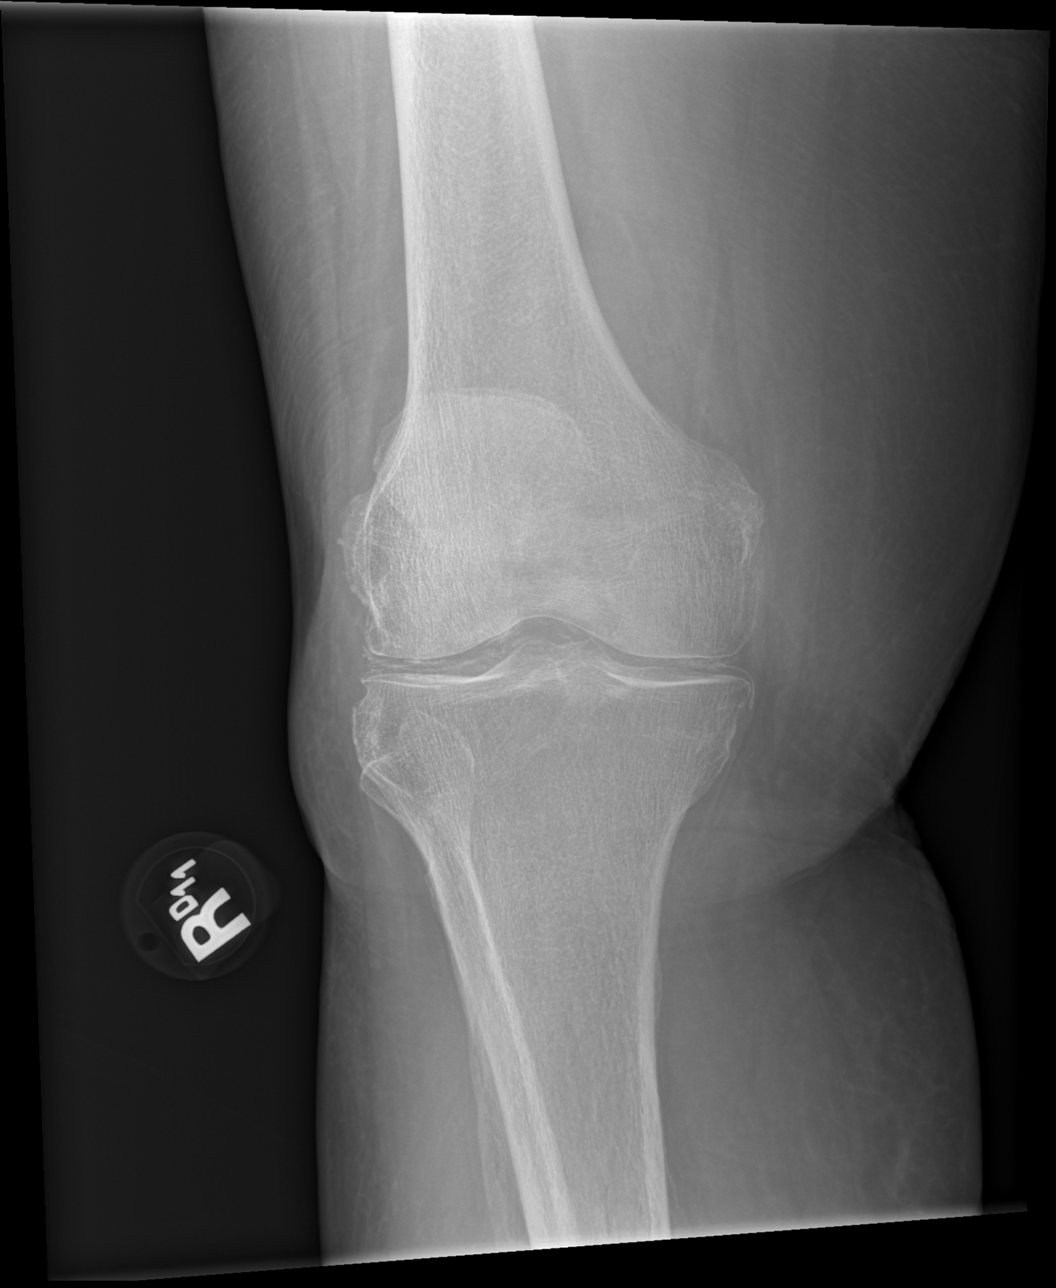

[x knee lat right]
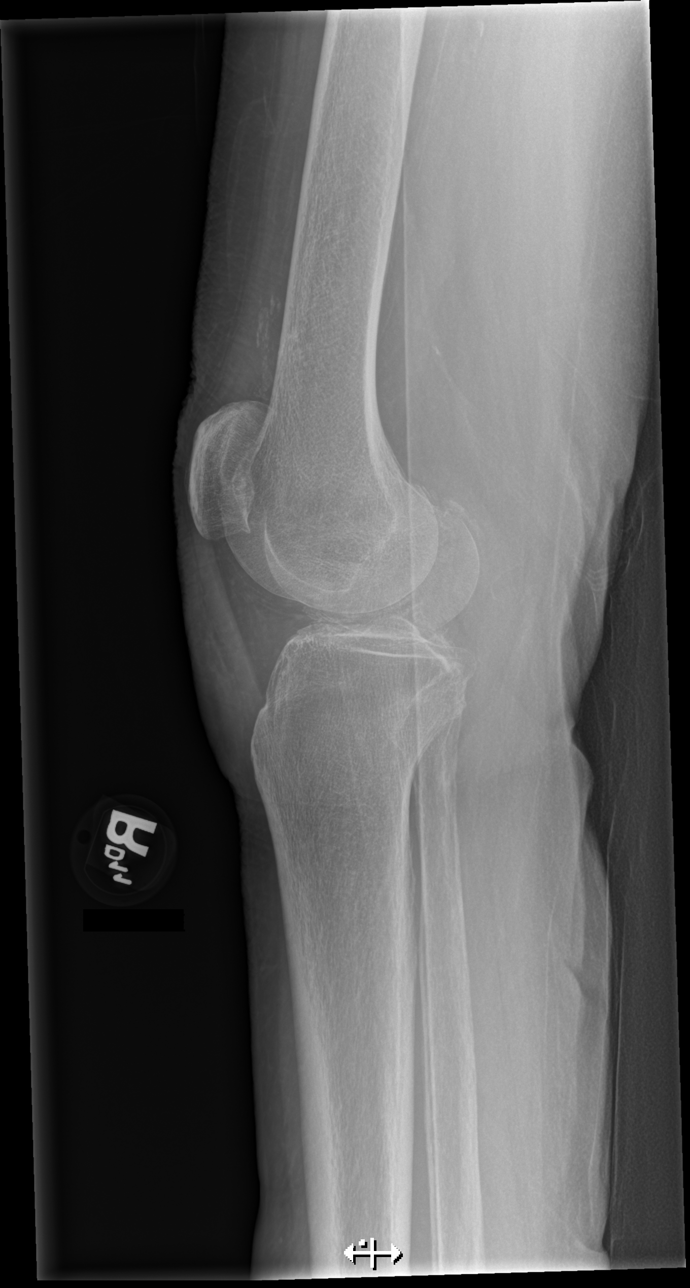

[4 of 4 positions shown; findings below may reference images not displayed]

FINDINGS: Frontal, bilateral oblique, lateral views of the right knee are
obtained. No fracture, subluxation, or dislocation. There is
moderate 3 compartmental osteoarthritis, with joint space narrowing
chondrocalcinosis seen within the medial and lateral compartments.
No joint effusion.
IMPRESSION: 1. Three compartmental osteoarthritis.  No acute fracture.
